# Patient Record
Sex: Female | Born: 1990 | Race: White | Hispanic: No | Marital: Single | Smoking: Current every day smoker
Health system: Southern US, Community
[De-identification: ages and names within clinical notes are randomized; demographics above are authoritative.]

## PROBLEM LIST (undated history)

## (undated) VITALS — BP 106/51 | HR 74 | Temp 98.2°F | Resp 18 | Ht 67.0 in | Wt 320.0 lb

## (undated) DIAGNOSIS — F319 Bipolar disorder, unspecified: Secondary | ICD-10-CM

## (undated) DIAGNOSIS — F32A Depression, unspecified: Secondary | ICD-10-CM

## (undated) DIAGNOSIS — F329 Major depressive disorder, single episode, unspecified: Secondary | ICD-10-CM

## (undated) DIAGNOSIS — S82899A Other fracture of unspecified lower leg, initial encounter for closed fracture: Secondary | ICD-10-CM

## (undated) DIAGNOSIS — F419 Anxiety disorder, unspecified: Secondary | ICD-10-CM

---
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
## 2011-09-18 LAB — GC/CHLAMYDIA PROBE AMP, URINE
Chlamydia, Swab/Urine, PCR: NEGATIVE
GC Probe Amp, Urine: NEGATIVE

## 2011-09-18 LAB — IRON AND TIBC
Saturation Ratios: 10 — ABNORMAL LOW
TIBC: 372

## 2011-09-18 LAB — TSH: TSH: 4.641

## 2011-09-18 LAB — PREGNANCY, URINE: Preg Test, Ur: NEGATIVE

## 2011-09-18 LAB — RPR: RPR Ser Ql: NONREACTIVE

## 2011-09-18 LAB — T4, FREE: Free T4: 1.22

## 2011-09-19 LAB — VALPROIC ACID LEVEL: Valproic Acid Lvl: 35.5 — ABNORMAL LOW

## 2011-09-29 LAB — LIPID PANEL: Total CHOL/HDL Ratio: 9.8

## 2011-09-29 LAB — FERRITIN: Ferritin: 19 (ref 10–291)

## 2011-09-29 LAB — TSH: TSH: 5.27 — ABNORMAL HIGH

## 2011-09-29 LAB — T3, FREE: T3, Free: 3.1 (ref 2.3–4.2)

## 2011-09-29 LAB — RPR: RPR Ser Ql: NONREACTIVE

## 2015-05-09 ENCOUNTER — Encounter (HOSPITAL_COMMUNITY): Payer: Self-pay | Admitting: *Deleted

## 2015-05-09 ENCOUNTER — Observation Stay (HOSPITAL_COMMUNITY)
Admission: AD | Admit: 2015-05-09 | Discharge: 2015-05-10 | Disposition: A | Discharge status: Home / Self Care | Payer: Self-pay | Attending: Psychiatry | Admitting: Psychiatry

## 2015-05-09 DIAGNOSIS — Z6841 Body Mass Index (BMI) 40.0 and over, adult: Secondary | ICD-10-CM | POA: Insufficient documentation

## 2015-05-09 DIAGNOSIS — F17210 Nicotine dependence, cigarettes, uncomplicated: Secondary | ICD-10-CM | POA: Insufficient documentation

## 2015-05-09 DIAGNOSIS — F419 Anxiety disorder, unspecified: Secondary | ICD-10-CM | POA: Insufficient documentation

## 2015-05-09 DIAGNOSIS — F3163 Bipolar disorder, current episode mixed, severe, without psychotic features: Principal | ICD-10-CM | POA: Diagnosis present

## 2015-05-09 HISTORY — DX: Anxiety disorder, unspecified: F41.9

## 2015-05-09 HISTORY — DX: Bipolar disorder, unspecified: F31.9

## 2015-05-09 HISTORY — DX: Depression, unspecified: F32.A

## 2015-05-09 HISTORY — DX: Major depressive disorder, single episode, unspecified: F32.9

## 2015-05-09 MED ORDER — MAGNESIUM HYDROXIDE 400 MG/5ML PO SUSP: 30.0000 mL | Freq: Every day | ORAL | Status: DC | PRN

## 2015-05-09 MED ORDER — LAMOTRIGINE 25 MG PO TABS
50.0000 mg | ORAL_TABLET | Freq: Every day | ORAL | Status: DC
  Filled 2015-05-09 (×2): qty 2

## 2015-05-09 MED ORDER — ACETAMINOPHEN 325 MG PO TABS: 650.0000 mg | ORAL_TABLET | Freq: Four times a day (QID) | ORAL | Status: DC | PRN

## 2015-05-09 MED ORDER — TRAZODONE HCL 50 MG PO TABS
50.0000 mg | ORAL_TABLET | Freq: Every evening | ORAL | Status: DC | PRN
  Administered 2015-05-09 (×2): 50 mg via ORAL
  Filled 2015-05-09 (×6): qty 1

## 2015-05-09 MED ORDER — HYDROXYZINE HCL 25 MG PO TABS
25.0000 mg | ORAL_TABLET | Freq: Four times a day (QID) | ORAL | Status: DC | PRN
  Administered 2015-05-09: 25 mg via ORAL
  Filled 2015-05-09: qty 1

## 2015-05-09 MED ORDER — ALUM & MAG HYDROXIDE-SIMETH 200-200-20 MG/5ML PO SUSP: 30.0000 mL | ORAL | Status: DC | PRN

## 2015-05-09 NOTE — Progress Notes (Signed)
Individual reports that she fell outside over the weekend while playing sports. She reports that she went to the ED and they told her, her right ankle was sprain.

## 2015-05-09 NOTE — Progress Notes (Signed)
Admission note: [facility]. She reports that she is in a depression and is here to get her "medication right." She was cooperative with the admission process onto the observation unit. She denies SI/HI at this time, but she reports suicide attempts in the past. She reports [facility]. She reports that she has been clean from pills, cocaine, and alcohol since September 2014. She reports she has a good support system, but this does not include her family, because they are all "addicts." Her belongings were locked in a locker- including clothes, sandals, cell phone, wallet, keys. Will continue to monitor on observation  Unit. Special checks q 15 mins in place to ensure safety.

## 2015-05-09 NOTE — Progress Notes (Signed)
Pt alert and cooperative. Affect/ mood sad and depressed. Denies SI/HI/A/V/H, verbally contracts for safety. Multiple phone conversations with family this evening. Emotional support and encouragement given. Will continue to monitor and evaluate for stabilization.
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
## 2015-05-10 DIAGNOSIS — R45851 Suicidal ideations: Secondary | ICD-10-CM

## 2015-05-10 DIAGNOSIS — F3132 Bipolar disorder, current episode depressed, moderate: Secondary | ICD-10-CM

## 2015-05-10 MED ORDER — GABAPENTIN 300 MG PO CAPS: 300.0000 mg | ORAL_CAPSULE | Freq: Two times a day (BID) | ORAL | Status: DC

## 2015-05-10 MED ORDER — GABAPENTIN 300 MG PO CAPS
300.0000 mg | ORAL_CAPSULE | Freq: Two times a day (BID) | ORAL | Status: DC
  Filled 2015-05-10 (×4): qty 1

## 2015-05-10 NOTE — [facility] Counselor (Signed)
Counselor met with pt at her bedside in the OBS unit to discuss discharge plans. Pt provided counselor with some background information in regards to her mental health illness and reported having panic attacks 3 to 4 times a day for two weeks. Pt reports feeling suicidal without a plan for about a week. Pt reports feeling depressed for about a week with symptoms including crying spells, worthlessness, hopelessness, helplessness, general depressed feelings, disinterest in things she used to care about, guilt, insomnia, increased irritability, and guilt. Pt reports that she has flashbacks that cause panic attacks as well as nightmares, and she avoids triggers for her flashbacks. Pt reports that she hadn't had a panic attack since 8/15 until they changed her medications. Pt reports that she does have manic episodes that can last as long as 3 months with symptoms including decreased sleep, increased energy, increased recklessness, increased productivity and anger. Pt currently denies SI/HI/AVH. Pt would like to discharge her psychiatric services from [facility] and start services at [facility].Pt will be provided information on how to expedite this change in an ongoing effort to ensure continuity of care for her mental health diagnosis. This information will be shared with OBS day Counselor for follow up. Pt will be re-evaluated in the morning by an extender to determine appropriate disposition.  Redmond Pulling, MA OBS Counselor

## 2015-05-10 NOTE — Discharge Instructions (Addendum)
For your continued mental health care, it is recommended that you continue counseling with [facility].  For your continued psychiatric needs, it is recommended that you obtain assistance thru either of the two agencies listed below. They both operate on a first come, first serve walk in basis. The walk in hours are listed as well: [facility] [address] [place], [place] [address] [phone] Walk in hours are Monday - Friday 8:00am - 3:00pm  [facility] [address] [place], [place] [phone] Walk in hours are 8:30am - 12:00pm and 2:00pm - 3:30pm  .fs Bipolar Disorder Bipolar disorder is a mental illness. The term bipolar disorder actually is used to describe a group of disorders that all share varying degrees of emotional highs and lows that can interfere with daily functioning, such as work, school, or relationships. Bipolar disorder also can lead to drug abuse, hospitalization, and suicide. The emotional highs of bipolar disorder are periods of elation or irritability and high energy. These highs can range from a mild form (hypomania) to a severe form (mania). People experiencing episodes of hypomania may appear energetic, excitable, and highly productive. People experiencing mania may behave impulsively or erratically. They often make poor decisions. They may have difficulty sleeping. The most severe episodes of mania can involve having very distorted beliefs or perceptions about the world and seeing or hearing things that are not real (psychotic delusions and hallucinations).  The emotional lows of bipolar disorder (depression) also can range from mild to severe. Severe episodes of bipolar depression can involve psychotic delusions and hallucinations. Sometimes people with bipolar disorder experience a state of mixed mood. Symptoms of hypomania or mania and depression are both present during this mixed-mood episode. SIGNS AND SYMPTOMS There are  signs and symptoms of the episodes of hypomania and mania as well as the episodes of depression. The signs and symptoms of hypomania and mania are similar but vary in severity. They include:  Inflated self-esteem or feeling of increased self-confidence.  Decreased need for sleep.  Unusual talkativeness (rapid or pressured speech) or the feeling of a need to keep talking.  Sensation of racing thoughts or constant talking, with quick shifts between topics that may or may not be related (flight of ideas).  Decreased ability to focus or concentrate.  Increased purposeful activity, such as work, studies, or social activity, or nonproductive activity, such as pacing, squirming and fidgeting, or finger and toe tapping.  Impulsive behavior and use of poor judgment, resulting in high-risk activities, such as having unprotected sex or spending excessive amounts of money. Signs and symptoms of depression include the following:   Feelings of sadness, hopelessness, or helplessness.  Frequent or uncontrollable episodes of crying.  Lack of feeling anything or caring about anything.  Difficulty sleeping or sleeping too much.  Inability to enjoy the things you used to enjoy.   Desire to be alone all the time.   Feelings of guilt or worthlessness.  Lack of energy or motivation.   Difficulty concentrating, remembering, or making decisions.  Change in appetite or weight beyond normal fluctuations.  Thoughts of death or the desire to harm yourself. DIAGNOSIS  Bipolar disorder is diagnosed through an assessment by your caregiver. Your caregiver will ask questions about your emotional episodes. There are two main types of bipolar disorder. People with type I bipolar disorder have manic episodes with or without depressive episodes. People with type II bipolar disorder have hypomanic episodes and major depressive episodes, which  are more serious than mild depression. The type of bipolar disorder  you have can make an important difference in how your illness is monitored and treated. Your caregiver may ask questions about your medical history and use of alcohol or drugs, including prescription medication. Certain medical conditions and substances also can cause emotional highs and lows that resemble bipolar disorder (secondary bipolar disorder).  TREATMENT  Bipolar disorder is a long-term illness. It is best controlled with continuous treatment rather than treatment only when symptoms occur. The following treatments can be prescribed for bipolar disorders:  Medication--Medication can be prescribed by a doctor that is an expert in treating mental disorders (psychiatrists). Medications called mood stabilizers are usually prescribed to help control the illness. Other medications are sometimes added if symptoms of mania, depression, or psychotic delusions and hallucinations occur despite the use of a mood stabilizer.  Talk therapy--Some forms of talk therapy are helpful in providing support, education, and guidance. A combination of medication and talk therapy is best for managing the disorder over time. A procedure in which electricity is applied to your brain through your scalp (electroconvulsive therapy) is used in cases of severe mania when medication and talk therapy do not work or work too slowly. Document Released: 03/23/2001 Document Revised: 04/11/2013 Document Reviewed: 01/10/2013 [facility] Patient Information 2015 [employer] [employer] [place]. This information is not intended to replace advice given to you by your health care provider. Make sure you discuss any questions you have with your health care provider.

## 2015-05-10 NOTE — Progress Notes (Signed)
Patient ID: Monica Huffman, female   DOB: Oct 19, 1991, 24 y.o.   MRN: [id number] Patient discharged per MD orders. Patient given education regarding follow-up appointments and medications. Patient denies any questions or concerns about these instructions. Patient was escorted to locker and given belongings before discharge to hospital lobby. Patient currently denies SI/HI and auditory and visual hallucinations on discharge.
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
## 2015-05-10 NOTE — Progress Notes (Signed)
Patient ID: [name], female   DOB: 03-18-1991, 24 y.o.   MRN: [id number] Patient pleasant and cooperative this AM. Denies SI, HI, AVH. Patient states she feels better. Rates depression 3 on 1 to 10 scale with 10 being the worst.  Patient has flat affect. Patient refused Lamictal this AM stating it does not help her. Patient asked to talk to provider about changing meds. Patient stated she had SI  Yesterday but not today. HX of overdose 2 years ago. Denies pain or discomfort. Plans to stop going to [facility] and [facility].

## 2015-05-10 NOTE — BHH Counselor (Addendum)
[facility] Assessment Progress Note  Counselor spoke with pt this morning about discharge planning and her current mental and social state. Pt states that she is not currently suicidal or homicidal and she has no AVH. She states that she would be okay with being discharged today and indicated that a friend will be able to come pick her up. She added that she [facility] [place] [facility] [place] [facility] [place].  When asked about psychiatric needs, pt indicated that she currently goes to [facility], but does not want to go there anymore, due to "having problems" with them for 1.5 years. She shared that the problems consisted of [facility] continually giving her medications, after she has advised them of the adverse affects she has had in the past with the same medications. She feels that they don't listen to her. Pt shared that she would like to [facility], as she has friends that go there and are satisfied with their services. Pt also indicated that she has [facility] services [facility], where she [facility].   Pt is aware of the walk-in policies of [facility] and willing to utilize that. Counselor also advised [facility] about [facility], who may also be able to assist her with psychiatric services.  Counselor will indicate both agencies on the discharge instructions.  Marcelle SmilingSamantha Ranjit Ashurst, MS, [facility] Counselor

## 2015-05-10 NOTE — [facility] Suicide Risk Assessment (Signed)
Suicide Risk Assessment  Discharge Assessment   [facility]   Demographic Factors:  Caucasian  Total Time spent with patient: 30 minutes  Musculoskeletal: Strength & Muscle Tone: within normal limits Gait & Station: normal Patient leans: N/A  Psychiatric Specialty Exam: Physical Exam  Review of Systems  Constitutional: Negative.  HENT: Negative.  Eyes: Negative.  Respiratory: Negative.  Cardiovascular: Negative.  Gastrointestinal: Negative.  Genitourinary: Negative.  Musculoskeletal: Negative.  Skin: Negative.  Neurological: Negative.  Endo/Heme/Allergies: Negative.  Psychiatric/Behavioral: The patient is nervous/anxious.    Blood pressure 106/51, pulse 74, temperature 98.2 F (36.8 C), temperature source Oral, resp. rate 18, height 5\' 7"  (1.702 m), weight 320 lb (145.151 kg), last menstrual period 05/06/2015.Body mass index is 50.11 kg/(m^2).  General Appearance: Casual  Eye Contact:: Good  Speech: Normal Rate  Volume: Normal  Mood: Anxious, mild  Affect: Congruent  Thought Process: Coherent  Orientation: Full (Time, Place, and Person)  Thought Content: WDL  Suicidal Thoughts: No  Homicidal Thoughts: No  Memory: Immediate; Good Recent; Good Remote; Good  Judgement: Good  Insight: Good  Psychomotor Activity: Normal  Concentration: Good  Recall: Good  Fund of Knowledge:Good  Language: Good  Akathisia: No  Handed: Right  AIMS (if indicated):    Assets: Housing Leisure Time Physical Health Resilience Social Support  ADL's: Intact  Cognition: WNL  Sleep:         Have you used any form of tobacco in the last 30 days? (Cigarettes, Smokeless Tobacco, Cigars, and/or Pipes): Yes  Has this patient used any form of tobacco in the last 30 days? (Cigarettes, Smokeless Tobacco, Cigars, and/or Pipes) No  Mental Status Per Nursing Assessment::   On Admission:   NA  Current Mental Status by Physician: NA  Loss Factors: NA  Historical Factors: NA  Risk Reduction Factors:   Sense of responsibility to family, Living with another person, especially a relative, Positive social support and Positive therapeutic relationship  Continued Clinical Symptoms:  Mild anxiety  Cognitive Features That Contribute To Risk:  None    Suicide Risk:  Minimal: No identifiable suicidal ideation.  Patients presenting with no risk factors but with morbid ruminations; may be classified as minimal risk based on the severity of the depressive symptoms  Principal Problem: Bipolar 1 disorder, mixed, severe Discharge Diagnoses:  Patient Active Problem List   Diagnosis Date Noted  . Bipolar 1 disorder, mixed, severe [F31.63] 05/09/2015    Priority: High      Plan Of Care/Follow-up recommendations:  Activity:  as tolerated Diet:  heart healthy diet  Is patient on multiple antipsychotic therapies at discharge:  No   Has Patient had three or more failed trials of antipsychotic monotherapy by history:  No  Recommended Plan for Multiple Antipsychotic Therapies: NA    LORD, JAMISON, PMH-NP 05/10/2015, 2:30 PM
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
## 2015-10-18 ENCOUNTER — Inpatient Hospital Stay (HOSPITAL_COMMUNITY)
Admission: RE | Admit: 2015-10-18 | Discharge: 2015-10-21 | DRG: 885 | Disposition: A | Discharge status: Home / Self Care | Payer: Federal, State, Local not specified - Other | Attending: Psychiatry | Admitting: Psychiatry

## 2015-10-18 ENCOUNTER — Encounter (HOSPITAL_COMMUNITY): Payer: Self-pay | Admitting: *Deleted

## 2015-10-18 DIAGNOSIS — F3130 Bipolar disorder, current episode depressed, mild or moderate severity, unspecified: Secondary | ICD-10-CM | POA: Diagnosis present

## 2015-10-18 DIAGNOSIS — F329 Major depressive disorder, single episode, unspecified: Secondary | ICD-10-CM | POA: Diagnosis present

## 2015-10-18 DIAGNOSIS — F411 Generalized anxiety disorder: Secondary | ICD-10-CM | POA: Diagnosis not present

## 2015-10-18 DIAGNOSIS — F4310 Post-traumatic stress disorder, unspecified: Secondary | ICD-10-CM | POA: Diagnosis not present

## 2015-10-18 DIAGNOSIS — F17210 Nicotine dependence, cigarettes, uncomplicated: Secondary | ICD-10-CM | POA: Diagnosis present

## 2015-10-18 DIAGNOSIS — R45851 Suicidal ideations: Secondary | ICD-10-CM | POA: Diagnosis present

## 2015-10-18 MED ORDER — INFLUENZA VAC SPLIT QUAD 0.5 ML IM SUSY
0.5000 mL | PREFILLED_SYRINGE | INTRAMUSCULAR | Status: DC
  Filled 2015-10-18: qty 0.5

## 2015-10-18 MED ORDER — MAGNESIUM HYDROXIDE 400 MG/5ML PO SUSP: 30.0000 mL | Freq: Every day | ORAL | Status: DC | PRN

## 2015-10-18 MED ORDER — LAMOTRIGINE 25 MG PO TABS
50.0000 mg | ORAL_TABLET | Freq: Every day | ORAL | Status: DC
  Filled 2015-10-18 (×2): qty 2

## 2015-10-18 MED ORDER — ALUM & MAG HYDROXIDE-SIMETH 200-200-20 MG/5ML PO SUSP: 30.0000 mL | ORAL | Status: DC | PRN

## 2015-10-18 MED ORDER — PNEUMOCOCCAL VAC POLYVALENT 25 MCG/0.5ML IJ INJ: 0.5000 mL | INJECTION | INTRAMUSCULAR | Status: DC

## 2015-10-18 MED ORDER — GABAPENTIN 300 MG PO CAPS
300.0000 mg | ORAL_CAPSULE | Freq: Two times a day (BID) | ORAL | Status: DC
  Administered 2015-10-18 – 2015-10-19 (×2): 300 mg via ORAL
  Filled 2015-10-18 (×5): qty 1

## 2015-10-18 MED ORDER — TRAZODONE HCL 50 MG PO TABS
50.0000 mg | ORAL_TABLET | Freq: Every evening | ORAL | Status: DC | PRN
  Administered 2015-10-18 – 2015-10-20 (×3): 50 mg via ORAL
  Filled 2015-10-18 (×2): qty 1
  Filled 2015-10-18: qty 14
  Filled 2015-10-18: qty 1

## 2015-10-18 MED ORDER — ACETAMINOPHEN 325 MG PO TABS
650.0000 mg | ORAL_TABLET | Freq: Four times a day (QID) | ORAL | Status: DC | PRN
  Administered 2015-10-19 – 2015-10-21 (×3): 650 mg via ORAL
  Filled 2015-10-18 (×3): qty 2
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
## 2015-10-18 NOTE — Tx Team (Signed)
Initial Interdisciplinary Treatment Plan   PATIENT STRESSORS: Educational concerns Financial difficulties Occupational concerns Substance abuse   PATIENT STRENGTHS: Ability for insight Active sense of humor Average or above average intelligence Capable of independent living   PROBLEM LIST: Problem List/Patient Goals Date to be addressed Date deferred Reason deferred Estimated date of resolution  Suicidal Ideation 10/18/2015     Depression with Anxiety 10/18/2015     Substance Abuse 10/18/2015     "Ive been sober over 1 1/2 yrs"      " I need my meds adjusted"                               DISCHARGE CRITERIA:  Ability to meet basic life and health needs Adequate post-discharge living arrangements Improved stabilization in mood, thinking, and/or behavior Medical problems require only outpatient monitoring  PRELIMINARY DISCHARGE PLAN: Attend aftercare/continuing care group Attend PHP/IOP Attend 12-step recovery group Outpatient therapy  PATIENT/FAMIILY INVOLVEMENT: This treatment plan has been presented to and reviewed with the patient, Alinda DoomsJasmin M Czajkowski, and/or family member, .  The patient and family have been given the opportunity to ask questions and make suggestions.  Rich BraveDuke, Krystian Younglove Lynn 10/18/2015, 6:45 PM

## 2015-10-18 NOTE — Progress Notes (Signed)
Monica Huffman is a very tearful, sad 24 yo single cuacsain obese female [facility] ( she was a [facility]) . She says " [name] been sober 2 yrs...- the patient lives. In a rehab facilittY) and I dont know what went wrong;.Marland Kitchen.Marland Kitchen.Marland Kitchen.[name] been having suicidal thoguths for the past 2 weeks. She shares her history with this nurse, is generally agreeable to the admission process until she finds out from this writer that she is being admitted....then she got VERY upset. After speaking with her, allowing her to cry and to process and listening to her , pt agreed to finish admission process with this Clinical research associatewriter, walk voluntarily on the unit and allow nurse to get her admission data inputted into the computer.Marland Kitchen.Marland Kitchen.Marland Kitchen.She deneis active SI , deneis allergies and says she smokes cigarettes and that shes " been clean " over 2 yrs.( from alcohol and other stuff) and says she has depression, anxiety and obesity as medical problems. She contracts for safety, states she doesn't " need to be here" and admission is completed.

## 2015-10-18 NOTE — Progress Notes (Signed)
Pt attended karaoke group this evening.  

## 2015-10-19 ENCOUNTER — Encounter (HOSPITAL_COMMUNITY): Payer: Self-pay | Admitting: Psychiatry

## 2015-10-19 DIAGNOSIS — F411 Generalized anxiety disorder: Secondary | ICD-10-CM

## 2015-10-19 DIAGNOSIS — F3130 Bipolar disorder, current episode depressed, mild or moderate severity, unspecified: Secondary | ICD-10-CM | POA: Diagnosis present

## 2015-10-19 DIAGNOSIS — F4310 Post-traumatic stress disorder, unspecified: Secondary | ICD-10-CM

## 2015-10-19 LAB — URINALYSIS, ROUTINE W REFLEX MICROSCOPIC
Bilirubin Urine: NEGATIVE
GLUCOSE, UA: NEGATIVE mg/dL
HGB URINE DIPSTICK: NEGATIVE
Ketones, ur: NEGATIVE mg/dL
Nitrite: NEGATIVE
PROTEIN: NEGATIVE mg/dL
SPECIFIC GRAVITY, URINE: 1.027 (ref 1.005–1.030)
Urobilinogen, UA: 0.2 mg/dL (ref 0.0–1.0)
pH: 5.5 (ref 5.0–8.0)

## 2015-10-19 LAB — URINE MICROSCOPIC-ADD ON

## 2015-10-19 MED ORDER — GABAPENTIN 400 MG PO CAPS
400.0000 mg | ORAL_CAPSULE | Freq: Two times a day (BID) | ORAL | Status: DC
  Administered 2015-10-19 – 2015-10-21 (×4): 400 mg via ORAL
  Filled 2015-10-19 (×5): qty 1
  Filled 2015-10-19 (×2): qty 14
  Filled 2015-10-19: qty 1
  Filled 2015-10-19: qty 14
  Filled 2015-10-19 (×2): qty 1
  Filled 2015-10-19: qty 14

## 2015-10-19 MED ORDER — NICOTINE 21 MG/24HR TD PT24
21.0000 mg | MEDICATED_PATCH | Freq: Every day | TRANSDERMAL | Status: DC
  Administered 2015-10-19 – 2015-10-21 (×3): 21 mg via TRANSDERMAL
  Filled 2015-10-19 (×6): qty 1

## 2015-10-19 MED ORDER — GABAPENTIN 300 MG PO CAPS: 300.0000 mg | ORAL_CAPSULE | Freq: Every day | ORAL | Status: DC

## 2015-10-19 MED ORDER — LITHIUM CARBONATE ER 300 MG PO TBCR
300.0000 mg | EXTENDED_RELEASE_TABLET | Freq: Every day | ORAL | Status: DC
  Administered 2015-10-19 – 2015-10-21 (×3): 300 mg via ORAL
  Filled 2015-10-19: qty 1
  Filled 2015-10-19 (×2): qty 7
  Filled 2015-10-19 (×2): qty 1

## 2015-10-19 MED ORDER — GABAPENTIN 400 MG PO CAPS
400.0000 mg | ORAL_CAPSULE | Freq: Every day | ORAL | Status: DC
  Administered 2015-10-19 – 2015-10-20 (×2): 400 mg via ORAL
  Filled 2015-10-19 (×2): qty 1
  Filled 2015-10-19: qty 7
  Filled 2015-10-19 (×2): qty 1
  Filled 2015-10-19: qty 7

## 2015-10-19 MED ORDER — LITHIUM CARBONATE ER 300 MG PO TBCR
600.0000 mg | EXTENDED_RELEASE_TABLET | Freq: Two times a day (BID) | ORAL | Status: DC
  Administered 2015-10-19 – 2015-10-21 (×5): 600 mg via ORAL
  Filled 2015-10-19 (×3): qty 2
  Filled 2015-10-19: qty 28
  Filled 2015-10-19 (×5): qty 2
  Filled 2015-10-19: qty 28
  Filled 2015-10-19 (×2): qty 2
  Filled 2015-10-19 (×2): qty 28

## 2015-10-19 NOTE — Progress Notes (Signed)
Recreation Therapy Notes  Date: 10.21.2016 Time: 9:30am Location: [address] Group Room   Group Topic: Stress Management  Goal Area(s) Addresses:  Patient will actively participate in stress management techniques presented during session.   Behavioral Response: Did not attend.     Markelle Asaro L Milano Rosevear, LRT/CTRS        Janee Ureste L 10/19/2015 1:35 PM 

## 2015-10-19 NOTE — Progress Notes (Signed)
D: Monica Huffman appeared depressed this a.m. She reported good sleep, fair appetite, and low energy. She rated her depression 8, hopelessness 7, and anxiety 8. She admitted some SI but contracted for safety. She complained of a headache (5/10). She wanted a nicotine patch. She declined flu and pneumonia vaccines. A: Meds given as ordered,including PRN Tylenol. Patch ordered. Q15 safety checks maintained. Support/encouragement offered. R: Pt remains free from harm and continues with treatment. Will continue to monitor for needs/safety.

## 2015-10-19 NOTE — [facility] Group Notes (Signed)
[facility] LCSW Group Therapy  10/19/2015 1:08 PM  Type of Therapy:  Group Therapy  Participation Level:  Active  Participation Quality:  Attentive  Affect:  Appropriate  Cognitive:  Alert and Oriented  Insight:  Improving  Engagement in Therapy:  Improving  Modes of Intervention:  Confrontation, Discussion, Education, Exploration, Problem-solving, Rapport Building, Socialization and Support  Summary of Progress/Problems: Feelings around Relapse. Group members discussed the meaning of relapse and shared personal stories of relapse, how it affected them and others, and how they perceived themselves during this time. Group members were encouraged to identify triggers, warning signs and coping skills used when facing the possibility of relapse. Social supports were discussed and explored in detail. Delisia was attentive and engaged during today's processing group. She shared that she could feel her mental health relapse coming before it happened. "I was doing manic things and spending a lot of money. That's a sign that I'm manic and about to crash." Pt reports feeling SI for 2 weeks and feeling [facility]. "I learned that it is safe to talk to my counselor about my mental health through this experience."   Smart, Kayce Betty LCSWA 10/19/2015, 1:08 PM

## 2015-10-19 NOTE — Progress Notes (Signed)
Pt attended AA meeting and was appropriate throughout the meeting.  
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
## 2015-10-19 NOTE — Plan of Care (Signed)
Problem: Alteration in mood & ability to function due to Goal: STG-Patient will attend groups Outcome: Progressing Pt attended evening group on 10/18/15.

## 2015-10-19 NOTE — Progress Notes (Signed)
  [facility] :  Will you be returning to the same living situation after discharge:  Yes,  [facility] At discharge, do you have transportation home?: Yes,  counselor from [facility] will pick her up on Sunday at 1:30PM. Do you have the ability to pay for your medications: Yes,  mental health  Release of information consent forms completed and submitted to medical records by CSW.  Patient to Follow up at: Follow-up Information    Follow up with [facility] On 10/29/2015.   Why:  Appt on this date at 1:40PM for medication management/hospital follow-up.    Contact information:   [address] [place], [place] [address] Phone: [phone] Fax: [phone]      Follow up with [facility].   Why:  Return to [facility] at discharge.    Contact information:   [address] [place], [place] [address] Phone: [phone] Fax: [phone]      Patient denies SI/HI: Yes,  during group/self report.     Safety Planning and Suicide Prevention discussed: Yes,  SPE completed with pt. Contact attempts [facility]. pt provided with SPI pamphlet and encouraged to share information with support network, ask questions, and talk about any concerns relating to SPE.  Have you used any form of tobacco in the last 30 days? (Cigarettes, Smokeless Tobacco, Cigars, and/or Pipes): Yes  Has patient been referred to the Quitline?: Patient refused referral  Smart, Lebron QuamHeather LCSWA  10/19/2015, 3:10 PM

## 2015-10-19 NOTE — Progress Notes (Addendum)
D: Pt is in the hallway talking on the phone upon approach.  She has depressed affect and mood.  She attended [facility] and reports that it was "good."  Pt reports SI without a plan.  She verbally contracts for safety.  She denies HII, denies hallucinations, denies pain.     A: Introduced self to pt.  Met with pt 1:1 and provided support and encouragement.  On-site provider contacted for admission orders.  Medications administered per order.  PRN medication administered for sleep. R: Pt is compliant with medications.  Pt verbally contracts for safety.  Will continue to monitor and assess.

## 2015-10-19 NOTE — [facility] Group Notes (Signed)
[facility] Note   10/19/2015 10:38 AM  Participation Quality:  Appropriate   Mood/Affect:  Depressed and Flat  Depression Rating:  7  Anxiety Rating:  7  Thoughts of Suicide:  No Will you contract for safety?   NA  Current AVH:  No  Plan for Discharge/Comments:  Pt [facility] and [facility]. Dx of bipolar disorder and reports recent depressive crash after manic episode-need for medication stablization. "I've been suicidal for the past two weeks."   Transportation Means: counselor from [facility] will transport pt home.   Supports: counselors from [facility]/sisters.   Smart, American FinancialHeather LCSWA
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
## 2015-10-19 NOTE — [facility] Suicide Risk Assessment (Signed)
[facility]   Nursing information obtained from:    Demographic factors:    Current Mental Status:    Loss Factors:    Historical Factors:    Risk Reduction Factors:    Total Time spent with patient: 45 minutes Principal Problem: <principal problem not specified> Diagnosis:   Patient Active Problem List   Diagnosis Date Noted  . GAD (generalized anxiety disorder) [F41.1] 10/18/2015  . Bipolar 1 disorder, mixed, severe (HCC) [F31.63] 05/09/2015     Continued Clinical Symptoms:  Alcohol Use Disorder Identification Test Final Score (AUDIT): 2 The "Alcohol Use Disorders Identification Test", Guidelines for Use in Primary Care, Second Edition.  World Science writerHealth Organization [facility](WHO). Score between 0-7:  no or low risk or alcohol related problems. Score between 8-15:  moderate risk of alcohol related problems. Score between 16-19:  high risk of alcohol related problems. Score 20 or above:  warrants further diagnostic evaluation for alcohol dependence and treatment.   CLINICAL FACTORS:   Bipolar Disorder:   Depressive phase Alcohol/Substance Abuse/Dependencies  Psychiatric Specialty Exam: Physical Exam  ROS  Blood pressure 115/62, pulse 78, temperature 98.1 F (36.7 C), temperature source Oral, resp. rate 16, height 5\' 7"  (1.702 m), weight 145.151 kg (320 lb), last menstrual period 10/10/2015, SpO2 96 %.Body mass index is 50.11 kg/(m^2).   COGNITIVE FEATURES THAT CONTRIBUTE TO RISK:  Closed-mindedness, Polarized thinking and Thought constriction (tunnel vision)    SUICIDE RISK:   Moderate:  Frequent suicidal ideation with limited intensity, and duration, some specificity in terms of plans, no associated intent, good self-control, limited dysphoria/symptomatology, some risk factors present, and identifiable protective factors, including available and accessible social support.  PLAN OF CARE: see Admission H and P  Medical Decision Making:  Review of  Psycho-Social Stressors (1), Review or order clinical lab tests (1), Review of Medication Regimen & Side Effects (2) and Review of New Medication or Change in Dosage (2)  I certify that inpatient services furnished can reasonably be expected to improve the patient's condition.   Jacqeline Broers A 10/19/2015, 12:38 PM

## 2015-10-19 NOTE — [facility] Suicide Risk Assessment (Signed)
[facility] INPATIENT:  Family/Significant Other Suicide Prevention Education  Suicide Prevention Education:  Contact Attempts: Burley Savereresa Hinkle (pt's counselor/support at [facility]) [phone] has been identified by the patient as the family member/significant other with whom the patient will be residing, and identified as the person(s) who will aid the patient in the event of a mental health crisis.  With written consent from the patient, two attempts were made to provide suicide prevention education, prior to and/or following the patient's discharge.  We were unsuccessful in providing suicide prevention education.  A suicide education pamphlet was given to the patient to share with family/significant other.  Date and time of first attempt: 10/19/15 at 11:23AM (message left for her to call back at earliest convenience).  Date and time of second attempt: 10/19/15 at 3:00PM (message left)   Smart, Burrel Legrand  LCSWA   10/19/2015, 11:22 AM

## 2015-10-20 LAB — COMPREHENSIVE METABOLIC PANEL
ALT: 14 U/L (ref 14–54)
ANION GAP: 6 (ref 5–15)
AST: 18 U/L (ref 15–41)
Albumin: 3.7 g/dL (ref 3.5–5.0)
Alkaline Phosphatase: 47 U/L (ref 38–126)
BILIRUBIN TOTAL: 0.4 mg/dL (ref 0.3–1.2)
BUN: 10 mg/dL (ref 6–20)
CO2: 27 mmol/L (ref 22–32)
Calcium: 9 mg/dL (ref 8.9–10.3)
Chloride: 107 mmol/L (ref 101–111)
Creatinine, Ser: 0.66 mg/dL (ref 0.44–1.00)
GFR calc Af Amer: >60 mL/min (ref >60)
GFR calc non Af Amer: >60 mL/min (ref >60)
GLUCOSE: 94 mg/dL (ref 65–99)
Potassium: 4.4 mmol/L (ref 3.5–5.1)
Sodium: 140 mmol/L (ref 135–145)
TOTAL PROTEIN: 6.9 g/dL (ref 6.5–8.1)

## 2015-10-20 LAB — CBC
HEMATOCRIT: 39.8 % (ref 36.0–46.0)
HEMOGLOBIN: 12.3 g/dL (ref 12.0–15.0)
MCH: 26.7 pg (ref 26.0–34.0)
MCHC: 30.9 g/dL (ref 30.0–36.0)
MCV: 86.5 fL (ref 78.0–100.0)
PLATELETS: 326 10*3/uL (ref 150–400)
RBC: 4.6 MIL/uL (ref 3.87–5.11)
RDW: 14.8 % (ref 11.5–15.5)
WBC: 10.7 10*3/uL — AB (ref 4.0–10.5)

## 2015-10-20 LAB — TSH: TSH: 5.274 u[IU]/mL — ABNORMAL HIGH (ref 0.350–4.500)

## 2015-10-20 NOTE — Progress Notes (Signed)
[facility] MD Progress Note  10/20/2015 12:37 PM Monica Huffman  MRN:  [id number] Subjective:  Pt states: "I'm doing much better today. I'm not feeling suicidal. The medication is definitely helping."  Objective: Pt seen and chart reviewed. Pt is alert/oriented x4, calm, cooperative, and appropriate to situation. Denies suicidal/homicidal ideation and psychosis and does not appear to be responding to internal stimuli. Cites good sleep and good appetite. Reports benefit from group participation. Denies medication side effects. Pt reports that she would like to discharge this weekend so that she can focus on her job situation.   Principal Problem: Bipolar I disorder, most recent episode depressed ([place]) Diagnosis:   Patient Active Problem List   Diagnosis Date Noted  . PTSD (post-traumatic stress disorder) [F43.10] 10/19/2015    Priority: High  . Bipolar I disorder, most recent episode depressed ([place]) [F31.30] 10/19/2015    Priority: High  . GAD (generalized anxiety disorder) [F41.1] 10/18/2015    Priority: High   Total Time spent with patient: 15 minutes  Past Medical History:  Past Medical History  Diagnosis Date  . Bipolar disorder ([place])   . Anxiety   . Depression    History reviewed. No pertinent past surgical history. Family History:  Family History  Problem Relation Age of Onset  . Ovarian cancer Mother   . Alcoholism Mother   . Drug abuse Mother   . 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 Marital Status: Single    Spouse Name: N/A  . Number of Children: N/A  . Years of Education: N/A   Social History Main Topics  . Smoking status: Current Every Day Smoker -- 1.00 packs/day for 10 years    Types:  Cigarettes  . Smokeless tobacco: None  . 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 Sexual Activity: Not Currently   Other Topics Concern  . None   Social History Narrative   Additional Social History:    Pain Medications: Pt denies Prescriptions: Neurontin and Lithium Over the Counter: Pt denies History of alcohol / drug use?: Yes Name of Substance 1: Alcohol  1 - Age of First Use: Unknown 1 - Amount (size/oz): Unknown 1 - Frequency: Unknown 1 - Duration: 2 years ago 1 - Last Use / Amount: 2 years ago Name of Substance 2: Opiates  2 - Age of First Use: Unknown 2 - Amount (size/oz): Unknown 2 - Frequency: Unknown 2 - Duration: 2 years ago 2 - Last Use / Amount: 2 years ago                Sleep: Good  Appetite:  Good  Current Medications: Current Facility-Administered Medications  Medication Dose Route Frequency Provider Last Rate Last Dose  . acetaminophen (TYLENOL) tablet 650 mg  650 mg Oral Q6H PRN Harriet Butte, NP   650 mg at 10/20/15 0755  . alum & mag hydroxide-simeth (MAALOX/MYLANTA) 200-200-20 MG/5ML suspension 30  mL  30 mL Oral Q4H PRN Harriet Butte, NP      . gabapentin (NEURONTIN) capsule 400 mg  400 mg Oral BID Nicholaus Bloom, MD   400 mg at 10/20/15 0755  . gabapentin (NEURONTIN) capsule 400 mg  400 mg Oral QHS Nicholaus Bloom, MD   400 mg at 10/19/15 2158  . Influenza vac split quadrivalent PF (FLUARIX) injection 0.5 mL  0.5 mL Intramuscular Tomorrow-1000 Nicholaus Bloom, MD   0 mL at 10/19/15 0844  . lithium carbonate (LITHOBID) CR tablet 300 mg  300 mg Oral Q1200 Nicholaus Bloom, MD   300 mg at 10/20/15 1129  . lithium carbonate (LITHOBID) CR tablet 600 mg  600 mg Oral Q12H Nicholaus Bloom, MD   600 mg at 10/20/15 0755  . magnesium hydroxide (MILK OF MAGNESIA) suspension 30  mL  30 mL Oral Daily PRN Harriet Butte, NP      . nicotine (NICODERM CQ - dosed in mg/24 hours) patch 21 mg  21 mg Transdermal Daily Nicholaus Bloom, MD   21 mg at 10/20/15 0755  . pneumococcal 23 valent vaccine (PNU-IMMUNE) injection 0.5 mL  0.5 mL Intramuscular Tomorrow-1000 Nicholaus Bloom, MD   0.5 mL at 10/19/15 0846  . traZODone (DESYREL) tablet 50 mg  50 mg Oral QHS PRN Harriet Butte, NP   50 mg at 10/19/15 2158    Lab Results:  Results for orders placed or performed during the hospital encounter of 10/18/15 (from the past 48 hour(s))  Urinalysis, Routine w reflex microscopic (not at [facility])     Status: Abnormal   Collection Time: 10/19/15 12:57 PM  Result Value Ref Range   Color, Urine YELLOW YELLOW   APPearance TURBID (A) CLEAR   Specific Gravity, Urine 1.027 1.005 - 1.030   pH 5.5 5.0 - 8.0   Glucose, UA NEGATIVE NEGATIVE mg/dL   Hgb urine dipstick NEGATIVE NEGATIVE   Bilirubin Urine NEGATIVE NEGATIVE   Ketones, ur NEGATIVE NEGATIVE mg/dL   Protein, ur NEGATIVE NEGATIVE mg/dL   Urobilinogen, UA 0.2 0.0 - 1.0 mg/dL   Nitrite NEGATIVE NEGATIVE   Leukocytes, UA TRACE (A) NEGATIVE    Comment: Performed at [facility]  Urine microscopic-add on     Status: Abnormal   Collection Time: 10/19/15 12:57 PM  Result Value Ref Range   Squamous Epithelial / LPF MANY (A) RARE   WBC, UA 3-6 <3 WBC/hpf   Urine-Other AMORPHOUS URATES/PHOSPHATES     Comment: Performed at [facility]  Comprehensive metabolic panel     Status: None   Collection Time: 10/20/15  5:00 AM  Result Value Ref Range   Sodium 140 135 - 145 mmol/L   Potassium 4.4 3.5 - 5.1 mmol/L   Chloride 107 101 - 111 mmol/L   CO2 27 22 - 32 mmol/L   Glucose, Bld 94 65 - 99 mg/dL   BUN 10 6 - 20 mg/dL   Creatinine, Ser 0.66 0.44 - 1.00 mg/dL   Calcium 9.0 8.9 - 10.3 mg/dL   Total Protein 6.9 6.5 - 8.1 g/dL   Albumin 3.7 3.5 - 5.0 g/dL   AST 18 15 - 41 U/L   ALT 14 14 - 54 U/L   Alkaline  Phosphatase 47 38 - 126 U/L   Total Bilirubin 0.4 0.3 - 1.2 mg/dL   GFR calc non Af Amer >60 >60 mL/min   GFR calc Af Amer >60 >60 mL/min  Comment: (NOTE) The eGFR has been calculated using the CKD EPI equation. This calculation has not been validated in all clinical situations. eGFR's persistently <60 mL/min signify possible Chronic Kidney Disease.    Anion gap 6 5 - 15    Comment: Performed at [facility]  TSH     Status: Abnormal   Collection Time: 10/20/15  5:00 AM  Result Value Ref Range   TSH 5.274 (H) 0.350 - 4.500 uIU/mL    Comment: Performed at [facility]  CBC     Status: Abnormal   Collection Time: 10/20/15  5:00 AM  Result Value Ref Range   WBC 10.7 (H) 4.0 - 10.5 K/uL   RBC 4.60 3.87 - 5.11 MIL/uL   Hemoglobin 12.3 12.0 - 15.0 g/dL   HCT 39.8 36.0 - 46.0 %   MCV 86.5 78.0 - 100.0 fL   MCH 26.7 26.0 - 34.0 pg   MCHC 30.9 30.0 - 36.0 g/dL   RDW 14.8 11.5 - 15.5 %   Platelets 326 150 - 400 K/uL    Comment: Performed at [facility]    Physical Findings: AIMS: Facial and Oral Movements Muscles of Facial Expression: None, normal Lips and Perioral Area: None, normal Jaw: None, normal Tongue: None, normal,Extremity Movements Upper (arms, wrists, hands, fingers): None, normal Lower (legs, knees, ankles, toes): None, normal, Trunk Movements Neck, shoulders, hips: None, normal, Overall Severity Severity of abnormal movements (highest score from questions above): None, normal Incapacitation due to abnormal movements: None, normal Patient's awareness of abnormal movements (rate only patient's report): No Awareness, Dental Status Current problems with teeth and/or dentures?: No Does patient usually wear dentures?: No  CIWA:  CIWA-Ar Total: 1 COWS:  COWS Total Score: 1  Musculoskeletal: Strength & Muscle Tone: within normal limits Gait & Station: normal Patient leans: N/A  Psychiatric Specialty  Exam: Review of Systems  Psychiatric/Behavioral: Positive for depression. Negative for suicidal ideas and hallucinations. The patient is nervous/anxious and has insomnia.   All other systems reviewed and are negative.   Blood pressure 108/53, pulse 89, temperature 98.1 F (36.7 C), temperature source Oral, resp. rate 16, height _0  (1.702 m), weight 145.151 kg (320 lb), last menstrual period 10/10/2015, SpO2 96 %.Body mass index is 50.11 kg/(m^2).  General Appearance: Casual and Fairly Groomed  Engineer, water::  Good  Speech:  Clear and Coherent and Normal Rate  Volume:  Normal  Mood:  Anxious and Depressed  Affect:  Appropriate and Congruent  Thought Process:  Coherent and Goal Directed  Orientation:  Full (Time, Place, and Person)  Thought Content:  WDL  Suicidal Thoughts:  No  Homicidal Thoughts:  No  Memory:  Immediate;   Fair Recent;   Fair Remote;   Fair  Judgement:  Fair  Insight:  Fair  Psychomotor Activity:  Normal  Concentration:  Fair  Recall:  [employer] [place]  Language: Fair  Akathisia:  No  Handed:    AIMS (if indicated):     Assets:  Communication Skills Desire for Improvement Physical Health Resilience Social Support  ADL's:  Intact  Cognition: WNL  Sleep:  Number of Hours: 6.5   Treatment Plan Summary: Daily contact with patient to assess and evaluate symptoms and progress in treatment and Medication management   Medications:  Continue Neurontin 461m bid Continue Lithium 3068mAM and 60053mid  Continue Nicotine patch Continue Trazodone 3m14mh prn insomnia  WithBenjamine MolaP-BC 10/20/2015, 12:37 PM  I reviewed chart and agreed with the findings and treatment Plan.  Berniece Andreas, MD

## 2015-10-20 NOTE — BHH Group Notes (Signed)
[facility] Group Notes:  (Clinical Social Work)  10/20/2015     10-11AM  Summary of Progress/Problems:   The main focus of today's process group was to learn how to use a decisional balance exercise to move forward in the Stages of Change.  Patients listed unhealthy coping techniques, then  Motivational Interviewing and the whiteboard were utilized to help patients explore in depth the perceived benefits and costs of unhealthy coping techniques, as well as the  benefits and costs of replacing that with a healthy coping skills.   The patient expressed that her own unhealthy coping involves using anything that can help her to "numb out" including smoking, relationships, and more.  She went to sleep with her head down on a table, participated both before and after sleep occurred.  Type of Therapy:  Group Therapy - Process   Participation Level:  Minimal  Participation Quality:  Attentive and Drowsy  Affect:  Blunted  Cognitive:  Appropriate  Insight:  Improving  Engagement in Therapy:  Improving  Modes of Intervention:  Education, Motivational Interviewing  Ambrose MantleMareida Grossman-Orr, LCSW 10/20/2015, 12:15 PM

## 2015-10-20 NOTE — Progress Notes (Signed)
D.  Pt pleasant on approach, denies complaints at this time.  Positive for evening AA group, interacting appropriately with peers on the unit.  Pt requests that prescription for Trazodone be given upon discharge.  Denies SI/HI/hallucinations at this time.  A.  Support and encouragement offered, medications given as ordered  R.  Pt remains safe on unit, will continue to monitor.

## 2015-10-20 NOTE — [facility] Group Notes (Signed)
[facility] Group Notes:  (Nursing/MHT/Case Management/Adjunct)  Date:  10/20/2015  Time:  11:08 AM  Type of Therapy:  Psychoeducational Skills  Participation Level:  Active  Participation Quality:  Appropriate  Affect:  Appropriate  Cognitive:  Appropriate  Insight:  Appropriate  Engagement in Group:  Engaged  Modes of Intervention:  Discussion  Summary of Progress/Problems: Pt did attend self inventory group, pt reported that she was negative SI/HI, no AH/VH noted. Pt rated her depression as a 3, and her helplessness/hopelessness as a 0.     Pt reported no issues or concerns.   Jacquelyne BalintForrest, Blakeley Scheier Shanta 10/20/2015, 11:08 AM

## 2015-10-20 NOTE — Progress Notes (Signed)
D Pt. Denies SI and HI at present time.  Pt. States she [facility] very [facility] but feels much better today.  Pt. Did sign the 72hr  Request on 10/18/2015 at 18:40pm.  Pt. Denies A and VH.  A Writer offered support and encouragement,  Discussed pt.'s day.  R Pt. Rates her day a 7 out of 10, her depression is a 3 and her anxiety a 6.  Pt. Remains safe on the unit .  Will continue to monitor to ensure pt. Safety.

## 2015-10-20 NOTE — Progress Notes (Signed)
Patient ID: Monica DoomsJasmin M Huffman, female   DOB: 1991-11-18, 24 y.o.   MRN: [id number]   D: Pt has been appropriate on the unit today, she is ready for discharge. Pt signed a 72 hour request for discharge on 10/18/15. Pt reported that she felt better, and that she likes the way she takes her medication at home. Pt reported that her depression was a 3, her hopelessness was a 3, and her anxiety was a 6. Pt reported that his goal for today was to work on a exit plan.  Pt reported being negative SI/HI, no AH/VH noted. A: 15 min checks continued for patient safety. R: Pt safety maintained.

## 2015-10-20 NOTE — [facility] Group Notes (Signed)
[facility] Group Notes:  (Nursing/MHT/Case Management/Adjunct)  Date:  10/20/2015  Time:  3:02 PM  Type of Therapy:  Psychoeducational Skills  Participation Level:  Active  Participation Quality:  Appropriate  Affect:  Appropriate  Cognitive:  Appropriate  Insight:  Appropriate  Engagement in Group:  Engaged  Modes of Intervention:  Discussion  Summary of Progress/Problems: Pt did attend healthy coping skills group.     Hila Bolding Shanta 10/20/2015, 3:02 PM 

## 2015-10-20 NOTE — Progress Notes (Signed)
Patient did attend the evening speaker AA meeting.  

## 2015-10-21 DIAGNOSIS — F3130 Bipolar disorder, current episode depressed, mild or moderate severity, unspecified: Principal | ICD-10-CM

## 2015-10-21 MED ORDER — GABAPENTIN 400 MG PO CAPS: 400.0000 mg | ORAL_CAPSULE | Freq: Two times a day (BID) | ORAL | Status: DC

## 2015-10-21 MED ORDER — LITHIUM CARBONATE ER 300 MG PO TBCR: 300.0000 mg | EXTENDED_RELEASE_TABLET | Freq: Every day | ORAL | Status: DC

## 2015-10-21 MED ORDER — TRAZODONE HCL 50 MG PO TABS: 50.0000 mg | ORAL_TABLET | Freq: Every evening | ORAL | Status: DC | PRN

## 2015-10-21 MED ORDER — NICOTINE 21 MG/24HR TD PT24: 21.0000 mg | MEDICATED_PATCH | Freq: Every day | TRANSDERMAL | Status: DC

## 2015-10-21 MED ORDER — LEVOTHYROXINE SODIUM 25 MCG PO TABS: 25.0000 ug | ORAL_TABLET | Freq: Every day | ORAL | Status: DC

## 2015-10-21 MED ORDER — LEVOTHYROXINE SODIUM 25 MCG PO TABS
25.0000 ug | ORAL_TABLET | Freq: Every day | ORAL | Status: DC
  Administered 2015-10-21: 25 ug via ORAL
  Filled 2015-10-21 (×2): qty 7
  Filled 2015-10-21: qty 1

## 2015-10-21 MED ORDER — LITHIUM CARBONATE ER 300 MG PO TBCR
600.0000 mg | EXTENDED_RELEASE_TABLET | Freq: Two times a day (BID) | ORAL | Status: DC
Start: 2015-10-21 — End: 2015-12-14

## 2015-10-21 NOTE — Progress Notes (Signed)
MD completed pt's DC SRA and DC order in pt chart. Pt completed daily assessment and on it shew wrote she denied SI and she rated her depression, hopelessness and anxiety " 0/0/3", respectively . She was given her DC AVS, its contents were reviewed with her and she was given sample meds as well as prescriptions for continuing medications. She stated she will keep her f/u appts and that she is " ready" to go today. All belongings were returned to her and she was escorted to bldg entrance and dc'd.

## 2015-10-21 NOTE — [facility] Suicide Risk Assessment (Signed)
[facility]   Demographic Factors:  Living alone  Total Time spent with patient: 30 minutes  Musculoskeletal: Strength & Muscle Tone: within normal limits Gait & Station: normal Patient leans: N/A  Psychiatric Specialty Exam: Physical Exam  ROS  Blood pressure 108/53, pulse 89, temperature 98.1 F (36.7 C), temperature source Oral, resp. rate 16, height 5\' 7"  (1.702 m), weight 145.151 kg (320 lb), last menstrual period 10/10/2015, SpO2 96 %.Body mass index is 50.11 kg/(m^2).  General Appearance: Casual  Eye Contact::  Good  Speech:  Normal Rate409  Volume:  Normal  Mood:  Anxious  Affect:  Appropriate  Thought Process:  Coherent  Orientation:  Full (Time, Place, and Person)  Thought Content:  WDL  Suicidal Thoughts:  No  Homicidal Thoughts:  No  Memory:  Immediate;   Good Recent;   Good Remote;   Good  Judgement:  Intact  Insight:  Good  Psychomotor Activity:  Normal  Concentration:  Fair  Recall:  [employer]Fair  Fund of Knowledge:Fair  Language: Fair  Akathisia:  No  Handed:  Right  AIMS (if indicated):     Assets:  Communication Skills Desire for Improvement Financial Resources/Insurance Housing Physical Health Resilience Social Support Transportation Vocational/Educational  Sleep:  Number of Hours: 5  Cognition: WNL  ADL's:  Intact   Have you used any form of tobacco in the last 30 days? (Cigarettes, Smokeless Tobacco, Cigars, and/or Pipes): Yes  Has this patient used any form of tobacco in the last 30 days? (Cigarettes, Smokeless Tobacco, Cigars, and/or Pipes) Yes, Prescription not provided because: Patient refused  Mental Status Per Nursing Assessment::   On Admission:     Current Mental Status by Physician: See above  Loss Factors: NA  Historical Factors: Prior suicide attempts  Risk Reduction Factors:   Sense of responsibility to family, Religious beliefs about death, Employed, Positive social support, Positive therapeutic  relationship and Positive coping skills or problem solving skills  Continued Clinical Symptoms:  Previous Psychiatric Diagnoses and Treatments  Cognitive Features That Contribute To Risk:  None    Suicide Risk:  Minimal: No identifiable suicidal ideation.  Patients presenting with no risk factors but with morbid ruminations; may be classified as minimal risk based on the severity of the depressive symptoms  Principal Problem: Bipolar I disorder, most recent episode depressed [facility](HCC) Discharge Diagnoses:  Patient Active Problem List   Diagnosis Date Noted  . PTSD (post-traumatic stress disorder) [F43.10] 10/19/2015  . Bipolar I disorder, most recent episode depressed (HCC) [F31.30] 10/19/2015  . GAD (generalized anxiety disorder) [F41.1] 10/18/2015    Follow-up Information    Follow up with [facility] On 10/29/2015.   Why:  Appt on this date at 1:40PM for medication management/hospital follow-up.    Contact information:   [address] [place], [place] [address] Phone: [phone] Fax: [phone]      Follow up with [facility].   Why:  Return to [facility] at discharge.    Contact information:   [address] [place], [place] [address] Phone: [phone] Fax: [phone]      Plan Of Care/Follow-up recommendations:  Activity:  As tolerated Diet:  Unchanged from the past  Is patient on multiple antipsychotic therapies at discharge:  No   Has Patient had three or more failed trials of antipsychotic monotherapy by history:  No  Recommended Plan for Multiple Antipsychotic Therapies: NA    Monica Huffman T. 10/21/2015, 9:03 AM
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
## 2015-10-21 NOTE — [facility] Group Notes (Signed)
[facility] Group Notes:  (Clinical Social Work)  10/21/2015  10:00-11:00AM  Summary of Progress/Problems:   The main focus of today's process group was to   1)  discuss the importance of adding supports  2)  define healthy supports versus unhealthy supports  3)  Discuss various healthy supports that can be put into place for various issues in life.  An emphasis was placed on using counselor, doctor, therapy groups, 12-step groups, and problem-specific support groups to expand supports.    The patient expressed full comprehension of the concepts presented, and agreed that there is a need to add more supports.  The patient stated she has learned a lot of coping skills from her counselor, and was encouraging to others, gave good feedback and suggestions.  Type of Therapy:  Process Group with Motivational Interviewing  Participation Level:  Active  Participation Quality:  Attentive, Sharing and Supportive  Affect:  Appropriate  Cognitive:  Alert, Appropriate and Oriented  Insight:  Engaged  Engagement in Therapy:  Engaged  Modes of Intervention:   Education, Teacher, English as a foreign languageupport and Processing, Activity  [facility]ilgrim[employer] PrideMareida Grossman-Orr, LCSW

## 2015-10-22 LAB — HEMOGLOBIN A1C
Hgb A1c MFr Bld: 5.5 % (ref 4.8–5.6)
Mean Plasma Glucose: 111 mg/dL

## 2015-12-13 ENCOUNTER — Inpatient Hospital Stay (HOSPITAL_COMMUNITY)
Admission: RE | Admit: 2015-12-13 | Discharge: 2015-12-17 | DRG: 881 | Disposition: A | Discharge status: Home / Self Care | Payer: Federal, State, Local not specified - Other | Attending: Psychiatry | Admitting: Psychiatry

## 2015-12-13 ENCOUNTER — Encounter (HOSPITAL_COMMUNITY): Payer: Self-pay

## 2015-12-13 DIAGNOSIS — F4310 Post-traumatic stress disorder, unspecified: Secondary | ICD-10-CM | POA: Diagnosis not present

## 2015-12-13 DIAGNOSIS — F319 Bipolar disorder, unspecified: Secondary | ICD-10-CM | POA: Diagnosis present

## 2015-12-13 DIAGNOSIS — F3130 Bipolar disorder, current episode depressed, mild or moderate severity, unspecified: Secondary | ICD-10-CM | POA: Diagnosis not present

## 2015-12-13 DIAGNOSIS — F329 Major depressive disorder, single episode, unspecified: Principal | ICD-10-CM | POA: Diagnosis present

## 2015-12-13 DIAGNOSIS — F411 Generalized anxiety disorder: Secondary | ICD-10-CM | POA: Diagnosis not present

## 2015-12-13 DIAGNOSIS — F17210 Nicotine dependence, cigarettes, uncomplicated: Secondary | ICD-10-CM | POA: Diagnosis present

## 2015-12-13 MED ORDER — GABAPENTIN 400 MG PO CAPS
400.0000 mg | ORAL_CAPSULE | Freq: Two times a day (BID) | ORAL | Status: DC
  Administered 2015-12-13 – 2015-12-16 (×10): 400 mg via ORAL
  Filled 2015-12-13 (×2): qty 1
  Filled 2015-12-13: qty 21
  Filled 2015-12-13 (×10): qty 1
  Filled 2015-12-13: qty 21
  Filled 2015-12-13 (×6): qty 1
  Filled 2015-12-13: qty 21

## 2015-12-13 MED ORDER — MAGNESIUM HYDROXIDE 400 MG/5ML PO SUSP: 30.0000 mL | Freq: Every day | ORAL | Status: DC | PRN

## 2015-12-13 MED ORDER — ALUM & MAG HYDROXIDE-SIMETH 200-200-20 MG/5ML PO SUSP: 30.0000 mL | ORAL | Status: DC | PRN

## 2015-12-13 MED ORDER — TRAZODONE HCL 50 MG PO TABS
50.0000 mg | ORAL_TABLET | Freq: Every evening | ORAL | Status: DC | PRN
  Administered 2015-12-13 (×2): 50 mg via ORAL
  Filled 2015-12-13 (×2): qty 1

## 2015-12-13 MED ORDER — NICOTINE 21 MG/24HR TD PT24
21.0000 mg | MEDICATED_PATCH | Freq: Every day | TRANSDERMAL | Status: DC
  Administered 2015-12-13 – 2015-12-17 (×5): 21 mg via TRANSDERMAL
  Filled 2015-12-13 (×6): qty 1

## 2015-12-13 MED ORDER — ACETAMINOPHEN 325 MG PO TABS
650.0000 mg | ORAL_TABLET | Freq: Four times a day (QID) | ORAL | Status: DC | PRN
Start: 2015-12-13 — End: 2015-12-17
  Administered 2015-12-15: 650 mg via ORAL
  Filled 2015-12-13: qty 2

## 2015-12-13 MED ORDER — LEVOTHYROXINE SODIUM 25 MCG PO TABS
25.0000 ug | ORAL_TABLET | Freq: Every day | ORAL | Status: DC
  Administered 2015-12-14 – 2015-12-17 (×4): 25 ug via ORAL
  Filled 2015-12-13: qty 7
  Filled 2015-12-13 (×6): qty 1

## 2015-12-13 NOTE — Plan of Care (Signed)
Problem: Diagnosis: Increased Risk For Suicide Attempt Goal: STG-Patient Will Attend All Groups On The Unit Outcome: Progressing Pt attended evening group on 12/13/15.

## 2015-12-13 NOTE — Progress Notes (Signed)
D: Pt has depressed affect and mood.  She reports her day was "okay" and that her goal tonight is to "sleep."  Pt reports she would like medication stabilization.  When asked if she was having thoughts of self-harm or suicide, pt states "the thoughts are still there, but I'm not going to do anything here."  Pt denies having a plan and she verbally contracts for safety.  Pt denies HI, denies pain.  She reports visual hallucinations, stating she has been "seeing stuff like dark shadows."  Pt has been visible in milieu interacting with peers and staff appropriately.  Pt attended evening group.   A: Introduced self to pt.  Met with pt 1:1 and provided support and encouragement.  Actively listened to pt.  Medications administered per order.  PRN medication administered for sleep. R: Pt is compliant with medications.  Pt verbally contracts for safety and reports that she will inform staff of her needs and concerns.  Will continue to monitor and assess.
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
## 2015-12-13 NOTE — Progress Notes (Signed)
Pt attended karaoke group this evening.  

## 2015-12-13 NOTE — Tx Team (Signed)
Initial Interdisciplinary Treatment Plan   PATIENT STRESSORS: Educational concerns Medication change or noncompliance3   PATIENT STRENGTHS: Ability for insight Active sense of humor Average or above average intelligence   PROBLEM LIST: Problem List/Patient Goals Date to be addressed Date deferred Reason deferred Estimated date of resolution  "hearing voices and seeing things."      Suicidal thoughts      "voices and stuff go away."      Financial stressors                                      DISCHARGE CRITERIA:  Ability to meet basic life and health needs Adequate post-discharge living arrangements Improved stabilization in mood, thinking, and/or behavior  PRELIMINARY DISCHARGE PLAN: Return to previous living arrangement  PATIENT/FAMIILY INVOLVEMENT: This treatment plan has been presented to and reviewed with the patient, Monica Huffman, Monica Huffman 12/13/2015, 6:04 PM

## 2015-12-14 ENCOUNTER — Encounter (HOSPITAL_COMMUNITY): Payer: Self-pay | Admitting: Psychiatry

## 2015-12-14 LAB — LITHIUM LEVEL: LITHIUM LVL: 0.07 mmol/L — AB (ref 0.60–1.20)

## 2015-12-14 LAB — PREGNANCY, URINE: Preg Test, Ur: NEGATIVE

## 2015-12-14 MED ORDER — ARIPIPRAZOLE 5 MG PO TABS
5.0000 mg | ORAL_TABLET | Freq: Every day | ORAL | Status: DC
  Administered 2015-12-14 – 2015-12-17 (×4): 5 mg via ORAL
  Filled 2015-12-14 (×6): qty 1
  Filled 2015-12-14: qty 7

## 2015-12-14 MED ORDER — TRAZODONE HCL 100 MG PO TABS
100.0000 mg | ORAL_TABLET | Freq: Every evening | ORAL | Status: DC | PRN
  Administered 2015-12-14 – 2015-12-16 (×3): 100 mg via ORAL
  Filled 2015-12-14: qty 1
  Filled 2015-12-14: qty 7
  Filled 2015-12-14 (×3): qty 1

## 2015-12-14 NOTE — Tx Team (Addendum)
Interdisciplinary Treatment Plan Update (Adult) Date: 12/14/2015    Time Reviewed: 9:30 AM  Progress in Treatment: Attending groups: Yes Participating in groups:Yes Taking medication as prescribed: Yes Tolerating medication: Yes Family/Significant other contact made: No, CSW assessing for appropriate contacts Patient understands diagnosis: Yes Discussing patient identified problems/goals with staff: Yes Medical problems stabilized or resolved: Yes Denies suicidal/homicidal ideation: Yes, patient denies SI Issues/concerns per patient self-inventory: Yes Other:  New problem(s) identified: N/A  Discharge Plan or Barriers: Patient plans to return to [facility] to follow up with outpatient services at [facility] or [facility].   Reason for Continuation of Hospitalization:  Depression Anxiety Medication Stabilization   Comments: N/A  Estimated length of stay: Discharge anticipated for 12/17/15   Patient is a 24 year old female admitted for SI with plan to overdose and AVH. Patient lives in [place] and plans to return at discharge. Patient will benefit from crisis stabilization, medication evaluation, group therapy, and psycho education in addition to case management for discharge planning. Patient and CSW reviewed pt's identified goals and treatment plan. Pt verbalized understanding and agreed to treatment plan.     Review of initial/current patient goals per problem list:  1. Goal(s): Patient will participate in aftercare plan   Met: Yes   Target date: 3-5 days post admission date   As evidenced by: Patient will participate within aftercare plan AEB aftercare provider and housing plan at discharge being identified.  12/16: Goal met. Patient plans to return to [place] to follow up with outpatient services.     2. Goal (s): Patient will exhibit decreased depressive symptoms and suicidal ideations.   Met: Yes   Target date: 3-5 days post admission  date   As evidenced by: Patient will utilize self rating of depression at 3 or below and demonstrate decreased signs of depression or be deemed stable for discharge by MD.   12/16: Goal not met: Pt presents with flat affect and depressed mood.  Pt with depression rating of 6 today.  Pt to show decreased sign of depression and a rating of 3 or less before d/c.    12/19: Goal met. Patient rates depression at 1 today, denies SI.    3. Goal(s): Patient will demonstrate decreased signs and symptoms of anxiety.   Met: Yes   Target date: 3-5 days post admission date   As evidenced by: Patient will utilize self rating of anxiety at 3 or below and demonstrated decreased signs of anxiety, or be deemed stable for discharge by MD  12/16: Goal not met: Pt presents with anxious mood and affect.  Pt with anxiety rating of 7 today.  Pt to show decreased sign of anxiety and a rating of 3 or less before d/c.  12/19: Goal met: Patient rates anxiety at 3 today.    5. Goal(s): Patient will demonstrate decreased signs of psychosis  * Met: Yes  * Target date: 3-5 days post admission date  * As evidenced by: Patient will demonstrate decreased frequency of AVH or return to baseline function  12/16: Goal not met: Pt to take medication as prescribed to decrease psychosis to baseline.   12/19: Goal met. Patient denies AVH     Attendees:  Patient:    Family:    Physician: Dr. Parke Poisson, Dr. Shea Evans, MD  12/14/2015 9:30 AM  Nursing: Drake Leach, Idell Pickles, Mayra Neer, RN  12/14/2015 9:30 AM  Clinical Social Worker Jahzion Brogden, Peri Maris, LCSWA; [place], LCSW 12/14/2015 9:30 AM  Other:  12/14/2015 9:30 AM  Clinical: Agustina Caroli, NP 12/14/2015 9:30 AM  Other: Lars Pinks, CM 12/14/2015 9:30 AM  Other:               Scribe for Treatment Team:  Tilden Fossa, MSW, [place] [phone]

## 2015-12-14 NOTE — Progress Notes (Signed)
Adult Psychoeducational Group Note  Date:  12/14/2015 Time:  8:53 PM  Group Topic/Focus:  Wrap-Up Group:   The focus of this group is to help patients review their daily goal of treatment and discuss progress on daily workbooks.  Participation Level:  Active  Participation Quality:  Appropriate  Affect:  Appropriate  Cognitive:  Alert  Insight: Appropriate  Engagement in Group:  Engaged  Modes of Intervention:  Discussion  Additional Comments:  Pt stated that she had a good day. Her goal for today was to talk with the doctor about adjusting her medications. Her goal for tomorrow is to work on discharge planning.   Kaleen OdeaCOOKE, Mariella Blackwelder R 12/14/2015, 8:53 PM

## 2015-12-14 NOTE — [facility] Group Notes (Addendum)
   [facility] LCSW Aftercare Discharge Planning Group Note  12/14/2015  8:45 AM   Participation Quality: Alert, Appropriate and Oriented  Mood/Affect: Depressed and Flat  Depression Rating: 6  Anxiety Rating: 7  Thoughts of Suicide: Pt endorses passive SI  Will you contract for safety? Yes  Current AVH: Pt denies  Plan for Discharge/Comments: Pt attended discharge planning group and actively participated in group. CSW provided pt with today's workbook. Patient plans to return to [facility] and follow up with outpatient services.   Transportation Means: Pt reports access to transportation  Supports: No supports mentioned at this time  Samuella BruinKristin Shelvy Perazzo, MSW, [employer]CS[employer] Clinical Social Worker [facility] [phone]

## 2015-12-14 NOTE — Progress Notes (Signed)
D: Patient is pleasant upon approach.  She is calm and cooperative.  Patient rates her depression as a 7; her hopelessness and anxiety as a 5.  She is observed in the day room interacting with her peers.  She is attending groups and participating in her treatment. A: Continue to monitor medication management and MD orders.

## 2015-12-14 NOTE — [facility] Group Notes (Signed)
[facility] LCSW Group Therapy 12/14/2015 1:15 PM Type of Therapy: Group Therapy Participation Level: Minimal  Participation Quality: Attentive  Affect: Appropriate  Cognitive: Alert and Oriented  Insight: Developing/Improving and Engaged  Engagement in Therapy: Developing/Improving and Engaged  Modes of Intervention: Clarification, Confrontation, Discussion, Education, Exploration, Limit-setting, Orientation, Problem-solving, Rapport Building, Dance movement psychotherapisteality Testing, Socialization and Support  Summary of Progress/Problems: The topic for today was feelings about relapse. Pt discussed what relapse prevention is to them and identified triggers that they are on the path to relapse. Pt processed their feeling towards relapse and was able to relate to peers. Pt discussed coping skills that can be used for relapse prevention. Patient did not participate in discussion despite CSW encouragement.    Monica BruinKristin Abrial Arrighi, MSW, [employer]CSWA Clinical Social Worker [facility] [phone]
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
## 2015-12-14 NOTE — H&P (Signed)
Psychiatric Admission Assessment Adult  Patient Identification: SAHMYA ARAI MRN:  [id number] Date of Evaluation:  12/14/2015 Chief Complaint:   " I have been hearing voices " Principal Diagnosis:  Bipolar Disorder Depressed, with psychotic symptoms Diagnosis:   Patient Active Problem List   Diagnosis Date Noted  . MDD (major depressive disorder) (HCC) [F32.9] 12/13/2015  . PTSD (post-traumatic stress disorder) [F43.10] 10/19/2015  . Bipolar I disorder, most recent episode depressed (HCC) [F31.30] 10/19/2015  . GAD (generalized anxiety disorder) [F41.1] 10/18/2015   History of Present Illness:: 24 year old single female, [facility] in [facility]   ( [facility] ). States she has been having auditory and visual  hallucinations over the last few weeks. States she hears voices telling her to kill herself and that she " cannot trust anyone ". Describes them as " someone yelling through a tunnel ". Also describes visual hallucinations- seeing " shadows that look like  Demons". Reports increased anxiety, depression, but stresses that at this time psychotic symptoms are major issue. Patient states she told her counselor about these symptoms so was brought to the hospital. At this time endorses feeling  Depressed, and endorses suicidal ideations, with thoughts of overdosing, and endorses neurovegetative symptoms as below States she has been compliant with medications. She has been on Neurontin, Lithium- states dose has been titrated up, and over the last two weeks on Haldol, which she feels is not helping her psychotic symptoms. Of note, admission Lithium level very low ( 0.07) , but patient states she has been compliant with it  Of note, has history of substance abuse but reports she has been sober x 2 years .  Associated Signs/Symptoms: Depression Symptoms:  depressed mood, anhedonia, insomnia, suicidal thoughts with specific plan, loss of energy/fatigue, decreased  appetite, low sense of self esteem (Hypo) Manic Symptoms:  Denies  Anxiety Symptoms:  No recent panic attacks, decreased anxiety since she has been on Neurontin  Psychotic Symptoms:  (+) hallucinations as above PTSD Symptoms: Reports history of PTSD symptoms stemming from childhood abuse, but states that these symptoms have tended to improve . Total Time spent with patient: 45 minutes  Past Psychiatric History:  States she has been diagnosed with " Bipolar Disorder, Borderline Personality Disorder, Anxiety, PTSD". Has had several psychiatric admissions since age 65. Last admission was about 6 weeks ago. At that time was diagnosed with Bipolar Disorder, Depressed, and was discharged on Neurontin, LiC03, Trazodone .  History of suicide attempts by overdosing , first time at age 39, last time in 2014. History of self cutting, but not over the last year. Reports prior history of hallucinatory experiences but not in several years. Follows up at [facility].   Risk to Self: Suicidal Ideation: Yes-Currently Present Suicidal Intent: Yes-Currently Present Is patient at risk for suicide?: Yes Suicidal Plan?: Yes-Currently Present Specify Current Suicidal Plan: To overdose Access to Means: Yes Specify Access to Suicidal Means: access to pills What has been your use of drugs/alcohol within the last 12 months?: past use of opiates and alcohol How many times?: 1 Other Self Harm Risks: NA Triggers for Past Attempts: None known Intentional Self Injurious Behavior: None Risk to Others: Homicidal Ideation: No Thoughts of Harm to Others: No Current Homicidal Intent: No Current Homicidal Plan: No Access to Homicidal Means: No Identified Victim: NA History of harm to others?: No Assessment of Violence: None Noted Violent Behavior Description: NA Does patient have access to weapons?: No Criminal Charges Pending?: No Does patient  have a court date: No Prior Inpatient Therapy: Prior Inpatient Therapy:  Yes Prior Therapy Dates: 2016 Prior Therapy Facilty/Provider(s): [facility] Reason for Treatment: Depression Prior Outpatient Therapy: Prior Outpatient Therapy: Yes Prior Therapy Dates: 2016 Prior Therapy Facilty/Provider(s): [facility] Reason for Treatment: Depression Does patient have an ACCT team?: No Does patient have Intensive In-House Services?  : No Does patient have [facility] services? : No Does patient have [facility] services?: No  Alcohol Screening: 1. How often do you have a drink containing alcohol?: Never 9. Have you or someone else been injured as a result of your drinking?: No 10. Has a relative or friend or a doctor or another health worker been concerned about your drinking or suggested you cut down?: No Alcohol Use Disorder Identification Test Final Score (AUDIT): 0 Brief Intervention: AUDIT score less than 7 or less-screening does not suggest unhealthy drinking-brief intervention not indicated Substance Abuse History in the last 12 months: history of alcohol abuse, opiate and cocaine abuse, but has been sober x 2 years . Consequences of Substance Abuse: No use of illicit drugs x 2 years  Previous Psychotropic Medications:  Most recently on Lithium, Neurontin, Trazodone and over the last two weeks Haldol . Psychological Evaluations:  No  Past Medical History:  Obesity, NKDA, smokes 1 PPD  Past Medical History  Diagnosis Date  . Bipolar disorder (HCC)   . Anxiety   . Depression    History reviewed. No pertinent past surgical history. Family History:  States she does not know her father or father's family history. Mother is alive, little contact, has two sisters , one brother  Family History  Problem Relation Age of Onset  . Ovarian cancer Mother   . Alcoholism Mother   . Drug abuse Mother   . Drug abuse Sister    Family Psychiatric  History:  States " everybody in my family abuses drugs and alcohol". An uncle  And an aunt committed suicide  Social History:  Single, no  children, lives in a Residential Setting ( [facility] ), denies legal issues, no SO at this time, she works part time, also enrolled in college . History  Alcohol Use No    Comment: Individual reports that she has been clean from alcohol since Septemeber 27,2014     History  Drug Use No    Comment: Pt reports that she has been clean from pills and cocaine since Septemeber 27, 2014    Social History   Social History  . Marital Status: Single    Spouse Name: N/A  . Number of Children: N/A  . Years of Education: N/A   Social History Main Topics  . Smoking status: Current Every Day Smoker -- 1.00 packs/day for 10 years    Types: Cigarettes  . Smokeless tobacco: None  . 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 Sexual Activity: Not Currently   Other Topics Concern  . None   Social History Narrative   Additional Social History:    Pain Medications: Pt denies Prescriptions: Lithium, Haldal, Nuerotin, Trazodon Over the Counter: Pt denies History of alcohol / drug use?: Yes Longest period of sobriety (when/how long): unknown Negative Consequences of Use: Financial, Legal, Personal relationships, Work / School Name of Substance 1: alcohol 1 - Age  of First Use: 17 1 - Amount (size/oz): unknown 1 - Frequency: unknown 1 - Duration: in recovery 1 - Last Use / Amount: 2 years ago Name of Substance 2: opiates 2 - Age of First Use: 17 2 - Amount (size/oz): unknown 2 - Frequency: unknown 2 - Duration: in recovery 2 - Last Use / Amount: 2 years ago  Allergies:  No Known Allergies Lab Results:  Results for orders placed or performed during the hospital encounter of 12/13/15 (from the past 48 hour(s))  Lithium level     Status: Abnormal   Collection Time: 12/14/15  6:27 AM  Result Value Ref  Range   Lithium Lvl 0.07 (L) 0.60 - 1.20 mmol/L    Comment: Performed at [facility]    Metabolic Disorder Labs:  Lab Results  Component Value Date   HGBA1C 5.5 10/20/2015   MPG 111 10/20/2015   No results found for: PROLACTIN Lab Results  Component Value Date   CHOL * 09/19/2008    205        ATP III CLASSIFICATION:  <200     mg/dL   Desirable  [id number]  mg/dL   Borderline High  >=956>=240    mg/dL   High   TRIG 213246* 08/65/784609/22/2009   HDL 21* 09/19/2008   CHOLHDL 9.8 09/19/2008   VLDL 49* 09/19/2008   LDLCALC * 09/19/2008    135        Total Cholesterol/HDL:CHD Risk Coronary Heart Disease Risk Table                     Men   Women  1/2 Average Risk   3.4   3.3    Current Medications: Current Facility-Administered Medications  Medication Dose Route Frequency Provider Last Rate Last Dose  . acetaminophen (TYLENOL) tablet 650 mg  650 mg Oral Q6H PRN Adonis BrookSheila Agustin, NP      . alum & mag hydroxide-simeth (MAALOX/MYLANTA) 200-200-20 MG/5ML suspension 30 mL  30 mL Oral Q4H PRN Adonis BrookSheila Agustin, NP      . gabapentin (NEURONTIN) capsule 400 mg  400 mg Oral BID BM & HS Adonis BrookSheila Agustin, NP   400 mg at 12/13/15 2138  . levothyroxine (SYNTHROID, LEVOTHROID) tablet 25 mcg  25 mcg Oral QAC breakfast Adonis BrookSheila Agustin, NP   25 mcg at 12/14/15 [phone]  . magnesium hydroxide (MILK OF MAGNESIA) suspension 30 mL  30 mL Oral Daily PRN Adonis BrookSheila Agustin, NP      . nicotine (NICODERM CQ - dosed in mg/24 hours) patch 21 mg  21 mg Transdermal Daily Adonis BrookSheila Agustin, NP   21 mg at 12/13/15 1705  . traZODone (DESYREL) tablet 50 mg  50 mg Oral QHS PRN Adonis BrookSheila Agustin, NP   50 mg at 12/13/15 2245   PTA Medications: Prescriptions prior to admission  Medication Sig Dispense Refill Last Dose  . gabapentin (NEURONTIN) 400 MG capsule Take 1 capsule (400 mg total) by mouth 3 (three) times daily - between meals and at bedtime. 90 capsule 0   . levothyroxine (SYNTHROID, LEVOTHROID) 25 MCG tablet Take 1 tablet  (25 mcg total) by mouth daily before breakfast. 30 tablet 0   . lithium carbonate (LITHOBID) 300 MG CR tablet Take 1 tablet (300 mg total) by mouth daily at 12 noon. 30 tablet 0   . lithium carbonate (LITHOBID) 300 MG CR tablet Take 2 tablets (600 mg total) by mouth every 12 (twelve) hours. 120 tablet 0   . nicotine (NICODERM CQ -  DOSED IN MG/24 HOURS) 21 mg/24hr patch Place 1 patch (21 mg total) onto the skin daily. 28 patch 0   . traZODone (DESYREL) 50 MG tablet Take 1 tablet (50 mg total) by mouth at bedtime as needed for sleep (May repeat). 30 tablet 0     Musculoskeletal: Strength & Muscle Tone: within normal limits Gait & Station: normal Patient leans: N/A  Psychiatric Specialty Exam: Physical Exam  Review of Systems  Constitutional: Negative.   HENT: Negative.   Eyes: Negative.   Respiratory: Negative.   Cardiovascular: Negative.   Gastrointestinal: Negative.   Genitourinary: Negative.   Musculoskeletal: Negative.   Skin: Negative.   Neurological: Negative for seizures.  Endo/Heme/Allergies: Negative.   Psychiatric/Behavioral: Positive for depression, suicidal ideas and hallucinations.  All other systems reviewed and are negative.   Blood pressure 111/61, pulse 92, temperature 97.8 F (36.6 C), temperature source Oral, resp. rate 16, height  (1.702 m), weight 345 lb (156.491 kg), SpO2 99 %.Body mass index is 54.02 kg/(m^2).  General Appearance: Fairly Groomed  Patent attorney::  Fair  Speech:  Normal Rate  Volume:  Normal  Mood:  depressed.  Affect:  Congruent and mildly constricted  Thought Process:  Intact and Linear  Orientation:  Full (Time, Place, and Person)  Thought Content:  (+) auditory and visual hallucinations as above, but at this time does not appear internally preoccuied. Thought process is linear .  Suicidal Thoughts:  Noat this time denies any suicidal ideations or any self injurious ideations, contracts for safety on unit   Homicidal Thoughts:  No   Memory:  recent and remote grossly intact   Judgement:  Fair  Insight:  Fair  Psychomotor Activity:  Normal  Concentration:  Good  Recall:  Good  Fund of Knowledge:Good  Language: Good  Akathisia:  Negative  Handed:  Right  AIMS (if indicated):     Assets:  Communication Skills Desire for Improvement Housing Resilience  ADL's:  Intact  Cognition: WNL  Sleep:        Treatment Plan Summary: Daily contact with patient to assess and evaluate symptoms and progress in treatment, Medication management, Plan inpatient admission and medications as below   Observation Level/Precautions:  15 minute checks  Laboratory:  We discussed obtaining TSH and other routine labs , but patient states " I already got my blood drawn, I don't want to be stuck any more. Maybe in a couple of days ".   Psychotherapy:  - supportive, milieu  Medications:  Patient does not want to restart Lithium- states " it is not working, and I don't like all the blood draws"  We discussed other options, to include Latuda, which she states was not helpful, Geodon, Abilify. Agrees to Abilify. Start Abilify at 5 mgrs QDAY initially - titrate if well tolerated   Consultations:  As needed   Discharge Concerns:  -   Estimated LOS: 5-6 days   Other:     I certify that inpatient services furnished can reasonably be expected to improve the patient's condition.   COBOS, FERNANDO 12/16/20168:20 AM

## 2015-12-14 NOTE — Plan of Care (Signed)
Problem: Diagnosis: Increased Risk For Suicide Attempt Goal: STG-Patient Will Comply With Medication Regime Outcome: Progressing Compliant with medication

## 2015-12-14 NOTE — Progress Notes (Signed)
Patient ID: Monica Huffman, female   DOB: 21-May-1991, 24 y.o.   MRN: [id number] D: Patient in dayroom on approach. Pt reports she had an episode of self harm but came to the dayroom to be around peers. Pt contracted to use coping skills and seek staff before acting on self harm behavior.  Pt reports she is tolerating medication well. Pt denies HI/AVH and pain. Pt attended and participated in evening AA group. Cooperative with assessment.   A: Met with pt 1:1. Medications administered as prescribed. Support and encouragement provided to engage in milieu. Pt encouraged to discuss feelings and come to staff with any question or concerns.   R: Patient remains safe and complaint with medications.

## 2015-12-14 NOTE — [facility] Suicide Risk Assessment (Signed)
[facility] INPATIENT:  Family/Significant Other Suicide Prevention Education  Suicide Prevention Education:  Patient Refusal for Family/Significant Other Suicide Prevention Education: The patient Monica Huffman has refused to provide written consent for family/significant other to be provided Family/Significant Other Suicide Prevention Education during admission and/or prior to discharge.  Physician notified. SPE reviewed with patient and brochure provided. Patient encouraged to return to hospital if having suicidal thoughts, patient verbalized his/her understanding and has no further questions at this time.   Sayer Masini, West CarboKristin L 12/14/2015, 11:19 AM

## 2015-12-14 NOTE — Progress Notes (Signed)
CSW received call from patient's counselor [facility] [phone] who reported that their agency is [facility]. Per Rosey Batheresa, patient has had 3 hospitalizations within the past 6 months and that her symptoms including AVH, mania, and anxiety have worsened within the past 6 weeks. Rosey Batheresa reports that patient can return to [facility] if stabilized and [facility] is not an options. CSW spoke with MD to relay Teresa's concerns. It is unclear if patient will need such a high level of care at discharge as it is expected that she will stabilize within several days. CSW will continue to follow.   Samuella BruinKristin Ifeanyichukwu Wickham, MSW, [employer] Clinical Social Worker [facility] [phone]

## 2015-12-15 DIAGNOSIS — F3130 Bipolar disorder, current episode depressed, mild or moderate severity, unspecified: Secondary | ICD-10-CM
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
## 2015-12-15 NOTE — Progress Notes (Signed)
[facility] MD Progress Note  12/15/2015 11:45 AM Monica Huffman  MRN:  [id number] Subjective:  Hearing voices some Principal Problem: Bipolar I disorder, most recent episode depressed (HCC) Diagnosis:   Patient Active Problem List   Diagnosis Date Noted  . MDD (major depressive disorder) (HCC) [F32.9] 12/13/2015  . PTSD (post-traumatic stress disorder) [F43.10] 10/19/2015  . Bipolar I disorder, most recent episode depressed (HCC) [F31.30] 10/19/2015  . GAD (generalized anxiety disorder) [F41.1] 10/18/2015   Total Time spent with patient: 30 minutes  Past Psychiatric History: 24 year old single female, lives in a [facility] ( [facility] ). Initially presented with hearing voices like "one yelling through a tunnel ". Also described visual hallucinations- seeing " shadows that look like Demons". Reported anxiety depression. Counsellor suggested for her to get admitted. Has been on lithium but level was low. Has been on haldol as well.  On admission to unit started on abilify.  On evaluation today feels abilify has helped some. Less voices. Still feels blah but not hopleless. No tremors noticeable  Anxiety not worsened. Sleep adequate Psychosis; some voices   Past Medical History:  Past Medical History  Diagnosis Date  . Bipolar disorder (HCC)   . Anxiety   . Depression    History reviewed. No pertinent past surgical history. Family History:  Family History  Problem Relation Age of Onset  . Ovarian cancer Mother   . Alcoholism Mother   . Drug abuse Mother   . 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 Marital Status: Single    Spouse Name: N/A   . Number of Children: N/A  . Years of Education: N/A   Social History Main Topics  . Smoking status: Current Every Day Smoker -- 1.00 packs/day for 10 years    Types: Cigarettes  . Smokeless tobacco: None  . 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 Sexual Activity: Not Currently   Other Topics Concern  . None   Social History Narrative   Additional Social History:    Pain Medications: Pt denies Prescriptions: Lithium, Haldal, Nuerotin, Trazodon Over the Counter: Pt denies History of alcohol / drug use?: Yes Longest period of sobriety (when/how long): unknown Negative Consequences of Use: Financial, Legal, Personal relationships, Work / School Name of Substance 1: alcohol 1 - Age of First Use: 17 1 - Amount (size/oz): unknown 1 - Frequency: unknown 1 - Duration: in recovery 1 - Last Use / Amount: 2 years ago Name of Substance 2: opiates 2 - Age of First Use: 17 2 - Amount (size/oz): unknown 2 - Frequency: unknown 2 - Duration: in recovery 2 - Last Use / Amount: 2 years ago                Sleep: Fair  Appetite:  Fair  Current Medications: Current Facility-Administered Medications  Medication Dose Route Frequency Provider  Last Rate Last Dose  . acetaminophen (TYLENOL) tablet 650 mg  650 mg Oral Q6H PRN Adonis BrookSheila Agustin, NP   650 mg at 12/15/15 [phone]  . alum & mag hydroxide-simeth (MAALOX/MYLANTA) 200-200-20 MG/5ML suspension 30 mL  30 mL Oral Q4H PRN Adonis BrookSheila Agustin, NP      . ARIPiprazole (ABILIFY) tablet 5 mg  5 mg Oral Daily Craige CottaFernando A Cobos, MD   5 mg at 12/15/15 0829  . gabapentin (NEURONTIN) capsule 400 mg  400 mg Oral BID BM & HS Adonis BrookSheila Agustin, NP   400 mg at 12/15/15 0829  . levothyroxine (SYNTHROID, LEVOTHROID) tablet 25 mcg  25 mcg Oral QAC breakfast Adonis BrookSheila Agustin, NP    25 mcg at 12/15/15 [phone]  . magnesium hydroxide (MILK OF MAGNESIA) suspension 30 mL  30 mL Oral Daily PRN Adonis BrookSheila Agustin, NP      . nicotine (NICODERM CQ - dosed in mg/24 hours) patch 21 mg  21 mg Transdermal Daily Adonis BrookSheila Agustin, NP   21 mg at 12/15/15 0830  . traZODone (DESYREL) tablet 100 mg  100 mg Oral QHS PRN Craige CottaFernando A Cobos, MD   100 mg at 12/14/15 2108    Lab Results:  Results for orders placed or performed during the hospital encounter of 12/13/15 (from the past 48 hour(s))  Lithium level     Status: Abnormal   Collection Time: 12/14/15  6:27 AM  Result Value Ref Range   Lithium Lvl 0.07 (L) 0.60 - 1.20 mmol/L    Comment: [facility]  Pregnancy, urine     Status: None   Collection Time: 12/14/15 10:46 AM  Result Value Ref Range   Preg Test, Ur NEGATIVE NEGATIVE    Comment:        THE SENSITIVITY OF THIS METHODOLOGY IS >20 mIU/mL. [facility]     Physical Findings: AIMS: Facial and Oral Movements Muscles of Facial Expression: None, normal Lips and Perioral Area: None, normal Jaw: None, normal Tongue: None, normal,Extremity Movements Upper (arms, wrists, hands, fingers): None, normal Lower (legs, knees, ankles, toes): None, normal, Trunk Movements Neck, shoulders, hips: None, normal, Overall Severity Severity of abnormal movements (highest score from questions above): None, normal Incapacitation due to abnormal movements: None, normal Patient's awareness of abnormal movements (rate only patient's report): No Awareness, Dental Status Current problems with teeth and/or dentures?: No Does patient usually wear dentures?: No  CIWA:    COWS:     Musculoskeletal: Strength & Muscle Tone: within normal limits Gait & Station: normal Patient leans: N/A  Psychiatric Specialty Exam: Review of Systems  Constitutional: Negative.   Cardiovascular: Negative for chest pain.  Skin: Negative for rash.   Psychiatric/Behavioral: Positive for depression.    Blood pressure 109/63, pulse 103, temperature 97.7 F (36.5 C), temperature source Oral, resp. rate 16, height 5\' 7"  (1.702 m), weight 156.491 kg (345 lb), SpO2 99 %.Body mass index is 54.02 kg/(m^2).  General Appearance: Casual  Eye Contact::  Fair  Speech:  Slow  Volume:  Decreased  Mood:  Dysphoric  Affect:  Congruent  Thought Process:  Coherent  Orientation:  Full (Time, Place, and Person)  Thought Content:  Hallucinations: Auditory, Paranoid Ideation and Rumination  Suicidal Thoughts:  No  Homicidal Thoughts:  No  Memory:  Immediate;   Fair Recent;   Fair  Judgement:  Poor  Insight:  Shallow  Psychomotor Activity:  Normal  Concentration:  Fair  Recall:  [employer]Fair  Fund of Knowledge:Fair  Language: Fair  Akathisia:  Negative  Handed:  Right  AIMS (if indicated):     Assets:  Desire for Improvement  ADL's:  Intact  Cognition: WNL  Sleep:  Number of Hours: 5.75   Treatment Plan Summary: Daily contact with patient to assess and evaluate symptoms and progress in treatment, Medication management and Plan as follows   MDD or Bipolar with psychotic features: will continue abilify. Consider to increase tomorrow if needed. Supportive therapy and medication compliance. Discharge planning within 3 to 5 days.    Apryl Brymer 12/15/2015, 11:45 AM

## 2015-12-15 NOTE — Progress Notes (Signed)
D) Pt states she didn't sleep last night much at all and wanted to sleep today. When encouraged to stay up so that she wouldn't set up a pattern, Pt was not keen on the idea and went back to bed. Affect is flat and mood is depressed. Tends to isolate and not interact much with her peers. Rates her depression at a 5, her hopelessness at a 6 and her anxiety at a 1. Denies SI and HI. A) Pt Given support and encouragement to stay up and attend the groups. Provided with a brief 1:1.  R) Denies SI and HI.

## 2015-12-15 NOTE — Progress Notes (Signed)
Adult Psychoeducational Group Note  Date:  12/15/2015 Time:  8:51 PM  Group Topic/Focus:  Wrap-Up Group:   The focus of this group is to help patients review their daily goal of treatment and discuss progress on daily workbooks.  Participation Level:  Active  Participation Quality:  Appropriate  Affect:  Appropriate  Cognitive:  Alert  Insight: Appropriate  Engagement in Group:  Engaged  Modes of Intervention:  Discussion  Additional Comments:  Pt participated in a group game.  Kaleen OdeaCOOKE, Sheena Simonis R 12/15/2015, 8:51 PM

## 2015-12-16 MED ORDER — HYDROXYZINE HCL 25 MG PO TABS
25.0000 mg | ORAL_TABLET | Freq: Three times a day (TID) | ORAL | Status: DC | PRN
  Administered 2015-12-16: 25 mg via ORAL
  Filled 2015-12-16: qty 10
  Filled 2015-12-16: qty 1

## 2015-12-16 NOTE — Progress Notes (Signed)
Patient ID: Monica DoomsJasmin M Huffman, female   DOB: July 15, 1991, 24 y.o.   Huff[facility] [id number] [facility]  12/16/2015 11:56 AM Monica Olegario ShearerM Doerner  Huffman:  [id number] Subjective:  Hearing voices some Principal Problem: Bipolar I disorder, most recent episode depressed (HCC) Diagnosis:   Patient Active Problem List   Diagnosis Date Noted  . MDD (major depressive disorder) (HCC) [F32.9] 12/13/2015  . PTSD (post-traumatic stress disorder) [F43.10] 10/19/2015  . Bipolar I disorder, most recent episode depressed (HCC) [F31.30] 10/19/2015  . GAD (generalized anxiety disorder) [F41.1] 10/18/2015   Total Time spent with patient: 30 minutes  Past Psychiatric History: 24 year old single female, lives in a [facility] ( [facility] ). Initially presented with hearing voices like "one yelling through a tunnel ". Also described visual hallucinations- seeing " shadows that look like Demons". Reported anxiety depression. Counsellor suggested for her to get admitted. Has been on lithium but level was low. Has been on haldol as well.  On admission to unit started on abilify.   On evaluation today feels abilify has helped some. Less voices and she feels not paranoid. Attending groups and affect is less constricted. Not feeling blah today.   Anxiety baseline and not worsened.  Sleep adequate Psychosis; some voices but distant    Past Medical History:  Past Medical History  Diagnosis Date  . Bipolar disorder (HCC)   . Anxiety   . Depression    History reviewed. No pertinent past surgical history. Family History:  Family History  Problem Relation Age of Onset  . Ovarian cancer Mother   . Alcoholism Mother   . Drug abuse Mother   . 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 Marital Status: Single    Spouse Name: N/A  . Number of Children: N/A  . Years of Education: N/A   Social History Main Topics  . Smoking status: Current Every Day Smoker -- 1.00 packs/day for 10 years    Types: Cigarettes  . Smokeless tobacco: None  . 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 Sexual Activity: Not Currently   Other Topics Concern  . None   Social History Narrative   Additional Social History:    Pain Medications: Pt denies Prescriptions: Lithium, Haldal, Nuerotin, Trazodon Over the Counter: Pt denies History of alcohol / drug use?: Yes Longest period of sobriety (when/how long): unknown Negative Consequences of Use: Financial, Legal, Personal relationships, Work / School Name of Substance 1: alcohol 1 - Age of First Use: 17 1 - Amount (size/oz): unknown 1 - Frequency: unknown 1 - Duration: in recovery 1 - Last Use / Amount: 2 years ago Name of Substance 2: opiates 2 - Age of First Use: 17 2 - Amount (size/oz): unknown 2 - Frequency: unknown 2 - Duration: in recovery 2 - Last Use / Amount: 2 years ago  Sleep: Fair  Appetite:  Fair  Current Medications: Current Facility-Administered Medications  Medication Dose Route Frequency Provider Last Rate Last Dose  . acetaminophen (TYLENOL) tablet 650 mg  650 mg Oral Q6H PRN Adonis Brook, NP   650 mg at 12/15/15 [phone]  . alum & mag hydroxide-simeth (MAALOX/MYLANTA) 200-200-20 MG/5ML suspension 30 mL  30 mL Oral Q4H PRN Adonis Brook, NP      . ARIPiprazole (ABILIFY) tablet 5 mg  5 mg Oral Daily Craige Cotta, MD   5 mg at 12/16/15 0901  . gabapentin (NEURONTIN) capsule 400 mg  400 mg Oral BID BM & HS  Adonis Brook, NP   400 mg at 12/16/15 0903  . levothyroxine (SYNTHROID, LEVOTHROID) tablet 25 mcg  25 mcg Oral QAC breakfast Adonis Brook, NP   25 mcg at 12/16/15 [phone]  . magnesium hydroxide (MILK OF MAGNESIA) suspension 30 mL  30 mL Oral Daily PRN Adonis Brook, NP      . nicotine (NICODERM CQ - dosed in mg/24 hours) patch 21 mg  21 mg Transdermal Daily Adonis Brook, NP   21 mg at 12/16/15 0903  . traZODone (DESYREL) tablet 100 mg  100 mg Oral QHS PRN Craige Cotta, MD   100 mg at 12/15/15 2132    Lab Results:  No results found for this or any previous visit (from the past 48 hour(s)).  Physical Findings: AIMS: Facial and Oral Movements Muscles of Facial Expression: None, normal Lips and Perioral Area: None, normal Jaw: None, normal Tongue: None, normal,Extremity Movements Upper (arms, wrists, hands, fingers): None, normal Lower (legs, knees, ankles, toes): None, normal, Trunk Movements Neck, shoulders, hips: None, normal, Overall Severity Severity of abnormal movements (highest score from questions above): None, normal Incapacitation due to abnormal movements: None, normal Patient's awareness of abnormal movements (rate only patient's report): No Awareness, Dental Status Current problems with teeth and/or dentures?: No Does patient usually wear dentures?: No  CIWA:    COWS:     Musculoskeletal: Strength & Muscle Tone: within normal limits Gait & Station: normal Patient leans: N/A  Psychiatric Specialty Exam: Review of Systems  Constitutional: Negative.   Cardiovascular: Negative for chest pain.  Skin: Negative for rash.  Neurological: Negative for tremors.  Psychiatric/Behavioral: Positive for depression.    Blood pressure 143/104, pulse 92, temperature 98 F (36.7 C), temperature source Oral, resp. rate 16, height  (1.702 m), weight 156.491 kg (345 lb), SpO2 99 %.Body mass index is 54.02 kg/(m^2).  General Appearance: Casual  Eye Contact::  Fair  Speech:   Slow  Volume:  Decreased  Mood:  Less dysphoric.   Affect: more reactive  Thought Process:  Coherent  Orientation:  Full (Time, Place, and Person)  Thought Content:  Hallucinations: Auditory, Paranoid Ideation and Rumination  Suicidal Thoughts:  No  Homicidal Thoughts:  No  Memory:  Immediate;   Fair Recent;   Fair  Judgement:  Poor  Insight:  Shallow  Psychomotor Activity:  Normal  Concentration:  Fair  Recall:  [employer] of Knowledge:Fair  Language: Fair  Akathisia:  Negative  Handed:  Right  AIMS (if indicated):     Assets:  Desire for Improvement  ADL's:  Intact  Cognition: WNL  Sleep:  Number of Hours: 6   Treatment Plan Summary: Daily contact with patient to assess and evaluate symptoms and progress in treatment, Medication management and Plan as follows   MDD or Bipolar with psychotic features: will continue abilify. Consider to increase if  needed. As of now mood has improved.  Supportive therapy and medication compliance. Discharge planning by Monday or Tuesday.Gilmore Laroche, Chara Marquard 12/16/2015, 11:56 AM

## 2015-12-16 NOTE — [facility] Group Notes (Addendum)
[facility] Group Notes:  (Nursing/MHT/Case Management/Adjunct)  Date:  12/15/2015  Time: 1000    Type of Therapy:  Nurse Education /  Identifying Needs: The group is focused on teaching patients how to identify their needs as well as identfy the behaviors required to get their needs met.  Participation Level:  Minimal  Participation Quality:  Appropriate  Affect:  Anxious  Cognitive:  Alert  Insight:  Appropriate  Engagement in Group:  Engaged  Modes of Intervention:  Education  Summary of Progress/Problems:  Monica Huffman Late entry for 12/15/2015

## 2015-12-16 NOTE — Progress Notes (Signed)
D Korie is seen at the med window this morning. Her hair is unkempt and her clothes are wrinkled and she says " I just got OOB". She completes her edaily assessment and on it she wrote she deneid SI and she rated her depression, hopelessness and anxiety  " 2/0/3", respectively.   A She is flat and depressed. She says she did not sleep last night and she is encouraged by this Clinical research associatewriter to speak to the practitioner about this.. She attends her groups.   R Safety is in place and poc contd.

## 2015-12-16 NOTE — Progress Notes (Signed)
Adult Psychoeducational Group Note  Date:  12/16/2015 Time:  9:05 PM  Group Topic/Focus:  Wrap-Up Group:   The focus of this group is to help patients review their daily goal of treatment and discuss progress on daily workbooks.  Participation Level:  Active  Participation Quality:  Appropriate and Attentive  Affect:  Appropriate  Cognitive:  Appropriate  Insight: Appropriate and Good  Engagement in Group:  Engaged  Modes of Intervention:  Education  Additional Comments:  Pt mentioned she was irritable and anxious throughout the day. Patient goal for tomorrow is to discharge. Pt plans to visit family services as a walk in for treatment. Pt goal is to get outside help such as a therapist and psychiatrist she can visit weekly or monthly for support to keep from having a relapse.   Merlinda FrederickKeshia S Jashawn Floyd 12/16/2015, 9:05 PM

## 2015-12-16 NOTE — Progress Notes (Signed)
D. Pt had been up and visible in milieu this evening, did attend and participate in group activity. Pt spoke about on-going depression but able to contract for safety while in the hospital. Pt seen interacting appropriately with peers in the milieu. Pt did receive bedtime medications without incident and did not verbalize any complaints of pain. A. Support and encouragement provided. R. Safety maintained, will continue to monitor.

## 2015-12-16 NOTE — [facility] Group Notes (Signed)
[facility] Group Notes:  (Clinical Social Work)   12/16/2015    1:15-2:15PM  Summary of Progress/Problems:   The main focus of today's process group was to   1)  Define health supports versus unhealthy supports  2)  Identify the areas of life in which we give and receive support;      3)  Discuss the importance of adding more supports; and  4) Identify supports that could be added to help with each of those areas.  An emphasis was placed on using counselor, doctor, therapy groups, 12-step groups, and problem-specific support groups to expand supports.    The patient expressed full comprehension of the concepts presented, and agreed that there is a need to add more supports.  The patient was very involved in the group topic, gave appropriate feedback to other patients.  She especially identified with the idea that people can make decisions on how to allocate their outgoing efforts/energy through the exercise done in group that can show the equitability of what they are doing currently.  Type of Therapy:  Process Group with Motivational Interviewing  Participation Level:  Active  Participation Quality:  Attentive, Sharing and Supportive  Affect:  Blunted  Cognitive:  Appropriate and Oriented  Insight:  Engaged  Engagement in Therapy:  Engaged  Modes of Intervention:   Education, Support and Processing, Activity  Ambrose MantleMareida Grossman-Orr, LCSW 12/16/2015   3:15 PM

## 2015-12-17 DIAGNOSIS — F411 Generalized anxiety disorder: Secondary | ICD-10-CM

## 2015-12-17 DIAGNOSIS — F4310 Post-traumatic stress disorder, unspecified: Secondary | ICD-10-CM

## 2015-12-17 MED ORDER — HYDROXYZINE HCL 25 MG PO TABS: 25.0000 mg | ORAL_TABLET | Freq: Three times a day (TID) | ORAL | Status: DC | PRN

## 2015-12-17 MED ORDER — ARIPIPRAZOLE 5 MG PO TABS
5.0000 mg | ORAL_TABLET | Freq: Every day | ORAL | Status: DC
Start: 2015-12-17 — End: 2020-07-31

## 2015-12-17 MED ORDER — LEVOTHYROXINE SODIUM 25 MCG PO TABS: 25.0000 ug | ORAL_TABLET | Freq: Every day | ORAL | Status: DC

## 2015-12-17 MED ORDER — GABAPENTIN 400 MG PO CAPS: 400.0000 mg | ORAL_CAPSULE | Freq: Two times a day (BID) | ORAL | Status: DC

## 2015-12-17 MED ORDER — TRAZODONE HCL 100 MG PO TABS: 100.0000 mg | ORAL_TABLET | Freq: Every day | ORAL | Status: DC

## 2015-12-17 MED ORDER — NICOTINE 21 MG/24HR TD PT24: 21.0000 mg | MEDICATED_PATCH | Freq: Every day | TRANSDERMAL | Status: DC

## 2015-12-17 NOTE — BHH Suicide Risk Assessment (Signed)
[facility]   Demographic Factors:  Caucasian  Total Time spent with patient: 30 minutes  Musculoskeletal: Strength & Muscle Tone: within normal limits Gait & Station: normal Patient leans: N/A  Psychiatric Specialty Exam: Physical Exam  Review of Systems  Psychiatric/Behavioral: Negative for depression and suicidal ideas. The patient is nervous/anxious. The patient does not have insomnia.   All other systems reviewed and are negative.   Blood pressure 109/58, pulse 82, temperature 97.9 F (36.6 C), temperature source Oral, resp. rate 16, height 5\' 7"  (1.702 m), weight 156.491 kg (345 lb), SpO2 99 %.Body mass index is 54.02 kg/(m^2).  General Appearance: Casual  Eye Contact::  Fair  Speech:  Clear and Coherent409  Volume:  Normal  Mood:  Anxious IMPROVING  Affect:  Congruent  Thought Process:  Goal Directed  Orientation:  Full (Time, Place, and Person)  Thought Content:  WDL  Suicidal Thoughts:  No  Homicidal Thoughts:  No  Memory:  Immediate;   Fair Recent;   Fair Remote;   Fair  Judgement:  Fair  Insight:  Fair  Psychomotor Activity:  Normal  Concentration:  Fair  Recall:  [employer]is[employer]Fair  Fund of Knowledge:Fair  Language: Fair  Akathisia:  No  Handed:  Right  AIMS (if indicated):     Assets:  Desire for Improvement  Sleep:  Number of Hours: 6  Cognition: WNL  ADL's:  Intact   Have you used any form of tobacco in the last 30 days? (Cigarettes, Smokeless Tobacco, Cigars, and/or Pipes): Yes  Has this patient used any form of tobacco in the last 30 days? (Cigarettes, Smokeless Tobacco, Cigars, and/or Pipes) Yes, A prescription for an FDA-approved tobacco cessation medication was offered at discharge and the patient refused  Mental Status Per Nursing Assessment::   On Admission:  Suicidal ideation indicated by patient  Current Mental Status by Physician: pt denies SI/HI/AH/VH  Loss Factors: NA  Historical Factors: Impulsivity  Risk  Reduction Factors:   Positive social support  Continued Clinical Symptoms:  Previous Psychiatric Diagnoses and Treatments  Cognitive Features That Contribute To Risk:  Polarized thinking    Suicide Risk:  Minimal: No identifiable suicidal ideation.  Patients presenting with no risk factors but with morbid ruminations; may be classified as minimal risk based on the severity of the depressive symptoms  Principal Problem: Bipolar I disorder, most recent episode depressed [facility](HCC) Discharge Diagnoses:  Patient Active Problem List   Diagnosis Date Noted  . PTSD (post-traumatic stress disorder) [F43.10] 10/19/2015  . Bipolar I disorder, most recent episode depressed (HCC) [F31.30] 10/19/2015  . GAD (generalized anxiety disorder) [F41.1] 10/18/2015    Follow-up Information    [facility].   Why:  Walk-in clinic for assessment for medication management services Monday-Friday between 8:30am - 12pm &  2pm- 3:30pm.    Contact information:   [address] [place] [address] [phone] Fax [phone]) [phone]      Plan Of Care/Follow-up recommendations:  Activity:  No restrictions Diet:  regular Tests:  as needed Other:  follow up with after care  Is patient on multiple antipsychotic therapies at discharge:  No   Has Patient had three or more failed trials of antipsychotic monotherapy by history:  No  Recommended Plan for Multiple Antipsychotic Therapies: NA    Meilah Delrosario MD 12/17/2015, 9:51 AM

## 2015-12-17 NOTE — BHH Group Notes (Signed)
   [facility]  12/17/2015  8:45 AM   Participation Quality: Alert, Appropriate and Oriented  Mood/Affect: anxious, tearful  Depression Rating: 1  Anxiety Rating: 3  Thoughts of Suicide: Pt denies SI/HI  Will you contract for safety? Yes  Current AVH: Pt denies  Plan for Discharge/Comments: Pt attended discharge planning group and actively participated in group. CSW provided pt with today's workbook. Patient reports feeling frustrated as she was hoping to discharge early this morning back to [facility]. CSW validated patient's frustration and apologized for miscommunication but agreed to share her concerns and hopes to discharge today with treatment team.   Transportation Means: Pt reports access to transportation  Supports: No supports mentioned at this time  Samuella BruinKristin Granger Chui, MSW, [employer] IncLCSWA Clinical Social Worker [facility] [phone]

## 2015-12-17 NOTE — Progress Notes (Signed)
  [facility] :  Will you be returning to the same living situation after discharge:  Yes,  patient plans to return to [facility] At discharge, do you have transportation home?: Yes,  patient reports transportation by friend Do you have the ability to pay for your medications: Yes,  patient will be provided with prescriptions at discharge  Release of information consent forms completed and in the chart;  Patient's signature needed at discharge.  Patient to Follow up at: Follow-up Information    [facility].   Why:  Walk-in clinic for assessment for medication management services Monday-Friday between 8:30am - 12pm &  2pm- 3:30pm.    Contact information:   [address] [place] [address] [phone] Fax [phone]      Next level of care provider has [facility] Link:no  Safety Planning and Suicide Prevention discussed: Yes,  with patient  Have you used any form of tobacco in the last 30 days? (Cigarettes, Smokeless Tobacco, Cigars, and/or Pipes): Yes  Has patient been referred to the Quitline?: Patient refused referral  Patient has been referred for addiction treatment: Yes- currently resides at [facility]  Monica Huffman, Monica Huffman 12/17/2015, 9:45 AM

## 2015-12-17 NOTE — Progress Notes (Signed)
Discharge note: Pt received both written and verbal discharge instructions. Pt verbalized understanding of discharge instructions. Pt agreed to f/u appt and med regimen. Pt received sample meds, prescriptions and belongings. Pt safely discharged to the lobby. 

## 2015-12-17 NOTE — Discharge Summary (Signed)
Physician Discharge Summary Note  Patient:  Monica Huffman is an 24 y.o., female MRN:  [id number] DOB:  1991-12-04 Patient phone:  [phone] (home)  Patient address:   [address] [place] [address],   Total Time spent with patient: Greater than 30 minutes  Date of Admission:  12/13/2015  Date of Discharge: 12-17-15  Reason for Admission: Worsening symptoms of Bipolar disorder  Principal Problem: Bipolar I disorder, most recent episode depressed [place])  Discharge Diagnoses: Patient Active Problem List   Diagnosis Date Noted  . MDD (major depressive disorder) (HCC) [F32.9] 12/13/2015  . PTSD (post-traumatic stress disorder) [F43.10] 10/19/2015  . Bipolar I disorder, most recent episode depressed (HCC) [F31.30] 10/19/2015  . GAD (generalized anxiety disorder) [F41.1] 10/18/2015   Past Psychiatric History: Bipolar affective disorder  Past Medical History:  Past Medical History  Diagnosis Date  . Bipolar disorder (HCC)   . Anxiety   . Depression    History reviewed. No pertinent past surgical history.  Family History:  Family History  Problem Relation Age of Onset  . Ovarian cancer Mother   . Alcoholism Mother   . Drug abuse Mother   . 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 Marital Status: Single    Spouse Name: N/A  . Number of Children: N/A  . Years of Education: N/A   Social History Main Topics  . Smoking status: Current Every Day Smoker -- 1.00 packs/day for 10 years    Types: Cigarettes  . Smokeless tobacco: None  . 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 Sexual Activity: Not Currently   Other Topics Concern  . None   Social History Narrative   Hospital Course: 24 year old single female, lives in [facility]( [facility]). States she has been having auditory and visual hallucinations over the last few weeks. States she hears voices telling her to kill herself and that she " cannot trust anyone ". Describes them as " someone yelling through a tunnel ". Also describes visual hallucinations- seeing " shadows that look like Demons". Reports increased anxiety, depression, but stresses that at this time psychotic symptoms are major issue. Patient states she told her counselor about these symptoms so was brought to the hospital. At this time endorses feelingdepressed, endorses suicidal ideations, with thoughts of overdosing. States she has been compliant with medications.  Upon her arrival & admision to the adult unit, Monica Huffman was evaluated & her presenting symptoms identified. The medication regimen for the presenting symptoms were discussed & initiated targeting those symptoms. Those medications are listed below. She was enrolled in the group counseling sessions & encouraged to participate in the unit programming. Her other pre-existing medical problems were identified & her home medications restarted accordingly. Monica Huffman was evaluated on daily basis by the clinical providers to assure her response to the treatment regimen.As her  treatment progressed,  improvement was noted as evidenced by her reports of decreasing symptoms, improved sleep, mood, affect, medication tolerance & active participation in the unit programming. She was encouraged to update her providers on her progress by daily completion of a self inventory assessement, noting mood, mental status, pain, any new symptoms, anxiety & or concerns.  Monica Huffman's  symptoms responded well to her treatment regimen combined with a therapeutic and supportive environment. She was motivated for recovery as evidenced by a positive/appropriate behavior and her interaction with the staff & fellow patients.She also worked closely with the treatment team and case manager to develop a discharge plan with appropriate goals to maintain mood stability after discharge. Coping skills, problem solving as well as relaxation therapies were also part of the unit programming.  Upon her hospital discharge, Monica Huffman was in much improved condition than upon admission.Her symptoms were reported as significantly decreased or resolved completely. She adamantly denies any SIHI,  AVH, delusional thoughts & or paranoia. She was motivated to continue taking medication with a goal of continued improvement in mental health. She will continue psychiatric care on an outpatient basis as noted below. She is provided with all the necessary information required to make theis appointment without problems. Monica Huffman received a 7 days worth, supply [facility]. [facility] in no apparent distress. Transportation per friend.  Physical Findings:  AIMS: Facial and Oral Movements Muscles of Facial Expression: None, normal Lips and Perioral Area: None, normal Jaw: None, normal Tongue: None, normal,Extremity Movements Upper (arms, wrists, hands, fingers): None, normal Lower (legs, knees, ankles, toes): None, normal, Trunk Movements Neck, shoulders, hips: None, normal, Overall Severity Severity of abnormal movements (highest score from questions above): None, normal Incapacitation due to abnormal movements: None, normal Patient's awareness of abnormal movements (rate only patient's report): No Awareness, Dental Status Current problems with teeth and/or dentures?: No Does patient usually wear dentures?: No  CIWA:    COWS:      Musculoskeletal: Strength & Muscle Tone: within normal limits Gait & Station: normal Patient leans: N/A  Psychiatric Specialty Exam: Review of Systems  Constitutional: Negative.   HENT: Negative.   Eyes: Negative.   Respiratory: Negative.   Cardiovascular: Negative.   Gastrointestinal: Negative.   Genitourinary: Negative.   Musculoskeletal: Negative.   Skin: Negative.   Neurological: Negative.   Endo/Heme/Allergies: Negative.   Psychiatric/Behavioral: Positive for depression (Stable). Negative for suicidal ideas, hallucinations, memory loss and substance abuse. The patient has insomnia (Stable). The patient is not nervous/anxious.     Blood pressure 109/58, pulse 82, temperature 97.9 F (36.6 C), temperature source Oral, resp. rate 16, height 5\' 7"  (1.702 m), weight 156.491 kg (345 lb), SpO2 99 %.Body mass index is 54.02 kg/(m^2).  See Md's SRA   Have you used any form of tobacco in the last 30 days? (Cigarettes, Smokeless Tobacco, Cigars, and/or Pipes): Yes  Has this patient used any form of tobacco in the last 30 days? (Cigarettes, Smokeless Tobacco, Cigars, and/or Pipes) Yes, Yes, A prescription for an FDA-approved tobacco cessation medication was offered at discharge and the patient refused  Metabolic Disorder Labs:  Lab Results  Component Value Date   HGBA1C 5.5 10/20/2015   MPG 111 10/20/2015   No results found for: PROLACTIN Lab Results  Component Value Date   CHOL * 09/19/2008    205        ATP III CLASSIFICATION:  <200     mg/dL  Desirable  200-239  mg/dL   Borderline High  >=161    mg/dL   High   TRIG 096* 04/54/0981   HDL 21* 09/19/2008   CHOLHDL 9.8 09/19/2008   VLDL 49* 09/19/2008   LDLCALC * 09/19/2008    135        Total Cholesterol/HDL:CHD Risk Coronary Heart Disease Risk Table                     Men   Women  1/2 Average Risk   3.4   3.3    See Psychiatric Specialty Exam and Suicide Risk Assessment completed by Attending Physician prior to  discharge.  Discharge destination:  Home  Is patient on multiple antipsychotic therapies at discharge:  No   Has Patient had three or more failed trials of antipsychotic monotherapy by history:  No  Recommended Plan for Multiple Antipsychotic Therapies: NA     Discharge Instructions    Diet - low sodium heart healthy    Complete by:  As directed             Medication List    STOP taking these medications        haloperidol 5 MG tablet  Commonly known as:  HALDOL     lithium carbonate 300 MG capsule      TAKE these medications      Indication   ARIPiprazole 5 MG tablet  Commonly known as:  ABILIFY  Take 1 tablet (5 mg total) by mouth daily. For mood control   Indication:  Mood control     gabapentin 400 MG capsule  Commonly known as:  NEURONTIN  Take 1 capsule (400 mg total) by mouth 3 (three) times daily - between meals and at bedtime. For agitation   Indication:  Agitation     hydrOXYzine 25 MG tablet  Commonly known as:  ATARAX/VISTARIL  Take 1 tablet (25 mg total) by mouth 3 (three) times daily as needed for anxiety.   Indication:  Anxiety     levothyroxine 25 MCG tablet  Commonly known as:  SYNTHROID, LEVOTHROID  Take 1 tablet (25 mcg total) by mouth daily before breakfast. For low thyroid function   Indication:  Underactive Thyroid     nicotine 21 mg/24hr patch  Commonly known as:  NICODERM CQ - dosed in mg/24 hours  Place 1 patch (21 mg total) onto the skin daily. For smoking cessation   Indication:  Nicotine Addiction     traZODone 100 MG tablet  Commonly known as:  DESYREL  Take 1 tablet (100 mg total) by mouth at bedtime. For sleep   Indication:  Trouble Sleeping       Follow-up Information    [facility] with [facility].   Why:  Walk-in clinic for assessment for medication management services Monday-Friday between 8:30am - 12pm &  2pm- 3:30pm.    Contact information:   [address] [place]  [address] [phone] Fax [phone]     Follow-up recommendations: Activity:  As tolerated Diet: As recommended by your primary care doctor. Keep all scheduled follow-up appointments as recommended.    Comments: Take all your medications as prescribed by your mental healthcare provider. Report any adverse effects and or reactions from your medicines to your outpatient provider promptly. Patient is instructed and cautioned to not engage in alcohol and or illegal drug use while on prescription medicines. In the event of worsening symptoms, patient  is instructed to call the crisis hotline, 911 and or go to the nearest ED for appropriate evaluation and treatment of symptoms. Follow-up with your primary care provider for your other medical issues, concerns and or health care needs.   Signed: Sanjuana Kava, PMHNP, FNP-BC 12/17/2015, 9:34 AM

## 2015-12-17 NOTE — Progress Notes (Signed)
D. Pt had been up and visible in milieu this evening, did attend and participate in evening group activity. Pt spoke about feeling anxious throughout the day today and requested and received vistaril to help with anxiety. Pt spoke about discharge in the morning and spoke about how her ride can only come around 11am. Pt denied any SI and spoke about feeling ready for discharge. Pt did receive all medications without incident this evening. A. Support and encouragement provided. R. Safety maintained, will continue to monitor.

## 2017-07-24 ENCOUNTER — Encounter (HOSPITAL_COMMUNITY): Payer: Self-pay | Admitting: Emergency Medicine

## 2017-07-24 ENCOUNTER — Emergency Department (HOSPITAL_COMMUNITY): Payer: Self-pay

## 2017-07-24 ENCOUNTER — Emergency Department (HOSPITAL_COMMUNITY)
Admission: EM | Admit: 2017-07-24 | Discharge: 2017-07-24 | Disposition: A | Discharge status: Home / Self Care | Payer: Self-pay | Attending: Emergency Medicine | Admitting: Emergency Medicine

## 2017-07-24 DIAGNOSIS — F17210 Nicotine dependence, cigarettes, uncomplicated: Secondary | ICD-10-CM | POA: Insufficient documentation

## 2017-07-24 DIAGNOSIS — Z8781 Personal history of (healed) traumatic fracture: Secondary | ICD-10-CM | POA: Insufficient documentation

## 2017-07-24 DIAGNOSIS — Z79899 Other long term (current) drug therapy: Secondary | ICD-10-CM | POA: Insufficient documentation

## 2017-07-24 DIAGNOSIS — R2242 Localized swelling, mass and lump, left lower limb: Secondary | ICD-10-CM | POA: Insufficient documentation

## 2017-07-24 DIAGNOSIS — M25572 Pain in left ankle and joints of left foot: Secondary | ICD-10-CM | POA: Insufficient documentation

## 2017-07-24 HISTORY — DX: Other fracture of unspecified lower leg, initial encounter for closed fracture: S82.899A

## 2017-07-24 MED ORDER — IBUPROFEN 800 MG PO TABS: 800.0000 mg | ORAL_TABLET | Freq: Three times a day (TID) | ORAL | 0 refills | Status: DC

## 2017-07-24 MED ORDER — IBUPROFEN 800 MG PO TABS
800.0000 mg | ORAL_TABLET | Freq: Once | ORAL | Status: AC
  Administered 2017-07-24: 800 mg via ORAL
  Filled 2017-07-24: qty 1

## 2017-07-24 NOTE — Discharge Instructions (Signed)
1. Medications: alternate ibuprofen and tylenol for pain control, usual home medications 2. Treatment: rest, ice, elevate and use brace, drink plenty of fluids, gentle stretching 3. Follow Up: Please followup with orthopedics as directed or your PCP in 1 week if no improvement for discussion of your diagnoses and further evaluation after today's visit; if you do not have a primary care doctor use the resource guide provided to find one; Please return to the ER for worsening symptoms or other concerns  

## 2017-07-24 NOTE — ED Triage Notes (Signed)
Pt presents with L ankle pain and stiffness x 4 days; pt denies any new injury, some swelling noted; pt ambulatory to triage

## 2017-07-24 NOTE — ED Provider Notes (Signed)
[facility]EMERGENCY DEPT Provider Note   CSN: [id number] Arrival date & time: 07/24/17  0037     History   Chief Complaint Chief Complaint  Patient presents with  . Ankle Pain    HPI Monica Huffman is a 26 y.o. female with a hx of Ankle fracture several years ago, anxiety, bipolar disorder presents to the Emergency Department complaining of gradual, persistent, progressively worsening left ankle pain onset 4 days ago. Patient reports associated feeling of stiffness in her ankle. She denies fevers or chills, redness of her ankle, nausea or vomiting. She denies known injury. Patient denies seeing an orthopedist after her ankle fracture. She will she's been able to ambulate but it does cause pain. She reports some minor swelling noted to the lateral and medial portion of the ankle. She denies history of gout, IV drug use.  The history is provided by the patient and medical records. No language interpreter was used.    Past Medical History:  Diagnosis Date  . Ankle fracture   . Anxiety   . Bipolar disorder (HCC)   . Depression     Patient Active Problem List   Diagnosis Date Noted  . PTSD (post-traumatic stress disorder) 10/19/2015  . Bipolar I disorder, most recent episode depressed (HCC) 10/19/2015  . GAD (generalized anxiety disorder) 10/18/2015    History reviewed. No pertinent surgical history.  OB History    No data available       Home Medications    Prior to Admission medications   Medication Sig Start Date End Date Taking? Authorizing Provider  ARIPiprazole (ABILIFY) 5 MG tablet Take 1 tablet (5 mg total) by mouth daily. For mood control 12/17/15   Armandina StammerNwoko, Agnes I, NP  gabapentin (NEURONTIN) 400 MG capsule Take 1 capsule (400 mg total) by mouth 3 (three) times daily - between meals and at bedtime. For agitation 12/17/15   Armandina StammerNwoko, Agnes I, NP  hydrOXYzine (ATARAX/VISTARIL) 25 MG tablet Take 1 tablet (25 mg total) by mouth 3 (three) times daily as needed for anxiety.  12/17/15   Armandina StammerNwoko, Agnes I, NP  ibuprofen (ADVIL,MOTRIN) 800 MG tablet Take 1 tablet (800 mg total) by mouth 3 (three) times daily. 07/24/17   Velvie Thomaston, Dahlia ClientHannah, PA-C  levothyroxine (SYNTHROID, LEVOTHROID) 25 MCG tablet Take 1 tablet (25 mcg total) by mouth daily before breakfast. For low thyroid function 12/17/15   Nwoko, Nicole KindredAgnes I, NP  nicotine (NICODERM CQ - DOSED IN MG/24 HOURS) 21 mg/24hr patch Place 1 patch (21 mg total) onto the skin daily. For smoking cessation 12/17/15   Armandina StammerNwoko, Agnes I, NP  traZODone (DESYREL) 100 MG tablet Take 1 tablet (100 mg total) by mouth at bedtime. For sleep 12/17/15   Armandina StammerNwoko, Agnes I, NP    Family History Family History  Problem Relation Age of Onset  . Ovarian cancer Mother   . Alcoholism Mother   . Drug abuse Mother   . Drug abuse Sister     Social History Social History  Substance Use Topics  . Smoking status: Current Every Day Smoker    Packs/day: 1.00    Years: 10.00    Types: Cigarettes  . Smokeless tobacco: Not on file  . 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  Gastrointestinal: Negative for nausea and  vomiting.  Musculoskeletal: Positive for arthralgias and joint swelling. Negative for back pain, neck pain and neck stiffness.  Skin: Negative for wound.  Neurological: Negative for numbness.  Hematological: Does not bruise/bleed easily.  Psychiatric/Behavioral: The patient is not nervous/anxious.   All other systems reviewed and are negative.    Physical Exam Updated Vital Signs BP 111/79   Pulse 97   Temp 98.1 F (36.7 C) (Oral)   Resp 18   Ht 5\' 7"  (1.702 m)   Wt (!) 145.2 kg (320 lb)   SpO2 98%   BMI 50.12 kg/m   Physical Exam  Constitutional: She appears well-developed and well-nourished. No distress.  HENT:    Head: Normocephalic and atraumatic.  Eyes: Conjunctivae are normal.  Neck: Normal range of motion.  Cardiovascular: Normal rate, regular rhythm and intact distal pulses.   Pulses:      Dorsalis pedis pulses are 2+ on the left side.       Posterior tibial pulses are 2+ on the left side.  Capillary refill < 3 sec  Pulmonary/Chest: Effort normal and breath sounds normal.  Musculoskeletal: She exhibits tenderness. She exhibits no edema.       Left hip: Normal.       Left knee: Normal.  Left ankle: Slightly decreased range of motion with action, full extension. Minimal tenderness to palpation along the medial and lateral joint lines. No erythema or significant swelling. No increased warmth.  Neurological: She is alert. Coordination normal.  Sensation intact to normal touch in the left lower extremity Strength 5/5 with dorsiflexion and plantar flexion  Skin: Skin is warm and dry. She is not diaphoretic.  No tenting of the skin  Psychiatric: She has a normal mood and affect.  Nursing note and vitals reviewed.    ED Treatments / Results   Radiology Dg Ankle Complete Left  Result Date: 07/24/2017 CLINICAL DATA:  Left ankle pain and stiffness for 4 days, nontraumatic. EXAM: LEFT ANKLE COMPLETE - 3+ VIEW COMPARISON:  None. FINDINGS: There is no evidence of fracture, dislocation, or joint effusion. There is no evidence of arthropathy or other focal bone abnormality. Soft tissues are unremarkable. IMPRESSION: Negative. Electronically Signed   By: Ellery Plunkaniel R Mitchell M.D.   On: 07/24/2017 01:31    Procedures Procedures (including critical care time)  Medications Ordered in ED Medications  ibuprofen (ADVIL,MOTRIN) tablet 800 mg (not administered)     Initial Impression / Assessment and Plan / ED Course  I have reviewed the triage vital signs and the nursing notes.  Pertinent labs & imaging results that were available during my care of the patient were reviewed by me and considered in my  medical decision making (see chart for details).     Patient X-Ray negative for obvious fracture or dislocation. Pain managed in ED. Pt advised to follow up with Her Medicare and subsequently orthopedics if symptoms persist. Patient given brace while in ED, conservative therapy recommended and discussed. Patient will be dc home & is agreeable with above plan.  Final Clinical Impressions(s) / ED Diagnoses   Final diagnoses:  Acute left ankle pain    New Prescriptions New Prescriptions   IBUPROFEN (ADVIL,MOTRIN) 800 MG TABLET    Take 1 tablet (800 mg total) by mouth 3 (three) times daily.     Awab Abebe, Boyd KerbsHannah, PA-C 07/24/17 [id number]    Campos, Kevin, MD 07/25/17 [phone]

## 2017-08-29 ENCOUNTER — Emergency Department (HOSPITAL_COMMUNITY)
Admission: EM | Admit: 2017-08-29 | Discharge: 2017-08-29 | Disposition: A | Discharge status: Home / Self Care | Payer: Self-pay | Attending: Emergency Medicine | Admitting: Emergency Medicine

## 2017-08-29 ENCOUNTER — Encounter (HOSPITAL_COMMUNITY): Payer: Self-pay | Admitting: Emergency Medicine

## 2017-08-29 DIAGNOSIS — Y929 Unspecified place or not applicable: Secondary | ICD-10-CM | POA: Insufficient documentation

## 2017-08-29 DIAGNOSIS — F17210 Nicotine dependence, cigarettes, uncomplicated: Secondary | ICD-10-CM | POA: Insufficient documentation

## 2017-08-29 DIAGNOSIS — Z79899 Other long term (current) drug therapy: Secondary | ICD-10-CM | POA: Insufficient documentation

## 2017-08-29 DIAGNOSIS — J3489 Other specified disorders of nose and nasal sinuses: Secondary | ICD-10-CM

## 2017-08-29 DIAGNOSIS — Y998 Other external cause status: Secondary | ICD-10-CM | POA: Insufficient documentation

## 2017-08-29 DIAGNOSIS — S0992XA Unspecified injury of nose, initial encounter: Secondary | ICD-10-CM | POA: Insufficient documentation

## 2017-08-29 DIAGNOSIS — W51XXXA Accidental striking against or bumped into by another person, initial encounter: Secondary | ICD-10-CM | POA: Insufficient documentation

## 2017-08-29 DIAGNOSIS — Y939 Activity, unspecified: Secondary | ICD-10-CM | POA: Insufficient documentation

## 2017-08-29 NOTE — Discharge Instructions (Signed)
Please read and follow all provided instructions.  Your diagnoses today include:  1. Nose pain   2. Injury of nose, initial encounter     Tests performed today include: Vital signs. See below for your results today.   Medications prescribed:  Take as prescribed   Home care instructions:  Follow any educational materials contained in this packet.  Follow-up instructions: Please follow-up with your primary care provider for further evaluation of symptoms and treatment   Return instructions:  Please return to the Emergency Department if you do not get better, if you get worse, or new symptoms OR  - Fever (temperature greater than 101.83F)  - Bleeding that does not stop with holding pressure to the area    -Severe pain (please note that you may be more sore the day after your accident)  - Chest Pain  - Difficulty breathing  - Severe nausea or vomiting  - Inability to tolerate food and liquids  - Passing out  - Skin becoming red around your wounds  - Change in mental status (confusion or lethargy)  - New numbness or weakness    Please return if you have any other emergent concerns.  Additional Information:  Your vital signs today were: BP 110/73 (BP Location: Right Arm)    Pulse 93    Temp 97.7 F (36.5 C) (Oral)    Resp 16    Ht 5\' 7"  (1.702 m)    Wt (!) 145.2 kg (320 lb)    LMP 08/30/2015    SpO2 98%    BMI 50.12 kg/m  If your blood pressure (BP) was elevated above 135/85 this visit, please have this repeated by your doctor within one month. ---------------
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
## 2017-08-29 NOTE — ED Notes (Signed)
Patient verbalized understanding of discharge instructions and denies any further needs or questions at this time. VS stable. Patient ambulatory with steady gait.  
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
## 2018-08-12 ENCOUNTER — Emergency Department (HOSPITAL_COMMUNITY)
Admission: EM | Admit: 2018-08-12 | Discharge: 2018-08-12 | Disposition: A | Discharge status: Home / Self Care | Payer: Self-pay | Attending: Emergency Medicine | Admitting: Emergency Medicine

## 2018-08-12 ENCOUNTER — Other Ambulatory Visit: Payer: Self-pay

## 2018-08-12 ENCOUNTER — Encounter (HOSPITAL_COMMUNITY): Payer: Self-pay | Admitting: Emergency Medicine

## 2018-08-12 ENCOUNTER — Emergency Department (HOSPITAL_COMMUNITY): Payer: Self-pay

## 2018-08-12 DIAGNOSIS — F17210 Nicotine dependence, cigarettes, uncomplicated: Secondary | ICD-10-CM | POA: Insufficient documentation

## 2018-08-12 DIAGNOSIS — Y929 Unspecified place or not applicable: Secondary | ICD-10-CM | POA: Insufficient documentation

## 2018-08-12 DIAGNOSIS — F319 Bipolar disorder, unspecified: Secondary | ICD-10-CM | POA: Insufficient documentation

## 2018-08-12 DIAGNOSIS — F419 Anxiety disorder, unspecified: Secondary | ICD-10-CM | POA: Insufficient documentation

## 2018-08-12 DIAGNOSIS — Y9389 Activity, other specified: Secondary | ICD-10-CM | POA: Insufficient documentation

## 2018-08-12 DIAGNOSIS — Z79899 Other long term (current) drug therapy: Secondary | ICD-10-CM | POA: Insufficient documentation

## 2018-08-12 DIAGNOSIS — Y998 Other external cause status: Secondary | ICD-10-CM | POA: Insufficient documentation

## 2018-08-12 DIAGNOSIS — W010XXA Fall on same level from slipping, tripping and stumbling without subsequent striking against object, initial encounter: Secondary | ICD-10-CM | POA: Insufficient documentation

## 2018-08-12 DIAGNOSIS — S93401A Sprain of unspecified ligament of right ankle, initial encounter: Secondary | ICD-10-CM | POA: Insufficient documentation

## 2018-08-12 NOTE — ED Provider Notes (Signed)
[facility]   CSN: [id number] Arrival date & time: 08/12/18  2053     History   Chief Complaint Chief Complaint  Patient presents with  . Ankle Pain    HPI Monica Huffman is a 27 y.o. female.  27 year old female presents after injuring her right ankle yesterday when she had a mechanical fall.  Now notes pain at the right lateral ankle is worse with ambulation and characterizes sharp.  Denies any associated right knee or right hip pain.  Has been using over-the-counter medications without relief.     Past Medical History:  Diagnosis Date  . Ankle fracture   . Anxiety   . Bipolar disorder (HCC)   . Depression     Patient Active Problem List   Diagnosis Date Noted  . PTSD (post-traumatic stress disorder) 10/19/2015  . Bipolar I disorder, most recent episode depressed (HCC) 10/19/2015  . GAD (generalized anxiety disorder) 10/18/2015    History reviewed. No pertinent surgical history.   OB History   None      Home Medications    Prior to Admission medications   Medication Sig Start Date End Date Taking? Authorizing Provider  ARIPiprazole (ABILIFY) 5 MG tablet Take 1 tablet (5 mg total) by mouth daily. For mood control 12/17/15   Armandina StammerNwoko, Agnes I, NP  gabapentin (NEURONTIN) 400 MG capsule Take 1 capsule (400 mg total) by mouth 3 (three) times daily - between meals and at bedtime. For agitation 12/17/15   Armandina StammerNwoko, Agnes I, NP  hydrOXYzine (ATARAX/VISTARIL) 25 MG tablet Take 1 tablet (25 mg total) by mouth 3 (three) times daily as needed for anxiety. 12/17/15   Armandina StammerNwoko, Agnes I, NP  ibuprofen (ADVIL,MOTRIN) 800 MG tablet Take 1 tablet (800 mg total) by mouth 3 (three) times daily. 07/24/17   Muthersbaugh, Dahlia ClientHannah, PA-C  levothyroxine (SYNTHROID, LEVOTHROID) 25 MCG tablet Take 1 tablet (25 mcg total) by mouth daily before breakfast. For low thyroid function 12/17/15   Nwoko, Nicole KindredAgnes I, NP  nicotine (NICODERM CQ - DOSED IN MG/24  HOURS) 21 mg/24hr patch Place 1 patch (21 mg total) onto the skin daily. For smoking cessation 12/17/15   Armandina StammerNwoko, Agnes I, NP  traZODone (DESYREL) 100 MG tablet Take 1 tablet (100 mg total) by mouth at bedtime. For sleep 12/17/15   Armandina StammerNwoko, Agnes I, NP    Family History Family History  Problem Relation Age of Onset  . Ovarian cancer Mother   . Alcoholism Mother   . Drug abuse Mother   . Drug abuse Sister     Social History Social History   Tobacco Use  . Smoking status: Current Every Day Smoker    Packs/day: 1.00    Years: 10.00    Pack years: 10.00    Types: Cigarettes  . Smokeless tobacco: Current User  Substance Use Topics  . 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    Physical Exam Updated Vital Signs BP 134/86 (BP Location: Right Arm)   Pulse 100   Temp 98.5 F (36.9 C) (Oral)   Resp 18   SpO2 98%  Physical Exam  Constitutional: She is oriented to person, place, and time. She appears well-developed and well-nourished.  Non-toxic appearance. No distress.  HENT:  Head: Normocephalic and atraumatic.  Eyes: Pupils are equal, round, and reactive to light. Conjunctivae, EOM and lids are normal.  Neck: Normal range of motion. Neck supple. No tracheal deviation present. No thyroid mass present.  Cardiovascular: Normal rate, regular rhythm and normal heart sounds. Exam reveals no gallop.  No murmur heard. Pulmonary/Chest: Effort normal and breath sounds normal. No stridor. No respiratory distress. She has no decreased breath sounds. She has no wheezes. She has no rhonchi. She has no rales.  Abdominal: Soft. Normal appearance and bowel sounds are normal.  She exhibits no distension. There is no tenderness. There is no rebound and no CVA tenderness.  Musculoskeletal: Normal range of motion. She exhibits no edema or tenderness.       Feet:  Neurological: She is alert and oriented to person, place, and time. She has normal strength. No cranial nerve deficit or sensory deficit. GCS eye subscore is 4. GCS verbal subscore is 5. GCS motor subscore is 6.  Skin: Skin is warm and dry. No abrasion and no rash noted.  Psychiatric: She has a normal mood and affect. Her speech is normal and behavior is normal.  Nursing note and vitals reviewed.    ED Treatments / Results  Labs (all labs ordered are listed, but only abnormal results are displayed) Labs Reviewed - No data to display  EKG None  Radiology Dg Ankle Complete Right  Result Date: 08/12/2018 CLINICAL DATA:  Lateral right ankle pain after stepping into a hole yesterday. Previous history of a right ankle fracture. EXAM: RIGHT ANKLE - COMPLETE 3+ VIEW COMPARISON:  05/11/2018 FINDINGS: Lateral soft tissue swelling over the right ankle. No evidence of acute fracture or dislocation. No focal bone lesion or bone destruction. Old ununited ossicles inferior to the medial malleolus without change. There is focal relative lucency and slight cortical irregularity at the anterolateral aspect of the talar dome which could indicate an osteochondral defect. Similar appearance present previously. IMPRESSION: Lateral soft tissue swelling. No acute bony abnormalities. Possible osteochondral defect in the talar dome. Electronically Signed   By: Burman NievesWilliam  Stevens M.D.   On: 08/12/2018 21:55    Procedures Procedures (including critical care time)  Medications Ordered in ED Medications - No data to display   Initial Impression / Assessment and Plan / ED Course  I have reviewed the triage vital signs and the nursing notes.  Pertinent labs & imaging results that were available during my care of the patient were  reviewed by me and considered in my medical decision making (see chart for details).     Patient placed in [facility] and given orthopedic referral  Final Clinical Impressions(s) / ED Diagnoses   Final diagnoses:  None    ED Discharge Orders    None       Lorre NickAllen, Rhema Boyett, MD 08/12/18 2223

## 2018-08-12 NOTE — ED Triage Notes (Signed)
Pt c/o right ankle pain and swelling after a fall yesterday. Ambulatory without difficulty.

## 2018-09-01 IMAGING — DX DG ANKLE COMPLETE 3+V*R*
3 series · 3 of 3 positions shown · non-contrast
Comparison: 05/11/2018

CLINICAL DATA: Lateral right ankle pain after stepping into a hole
yesterday. Previous history of a right ankle fracture.

EXAM:
RIGHT ANKLE - COMPLETE 3+ VIEW

[x ankle ap right]
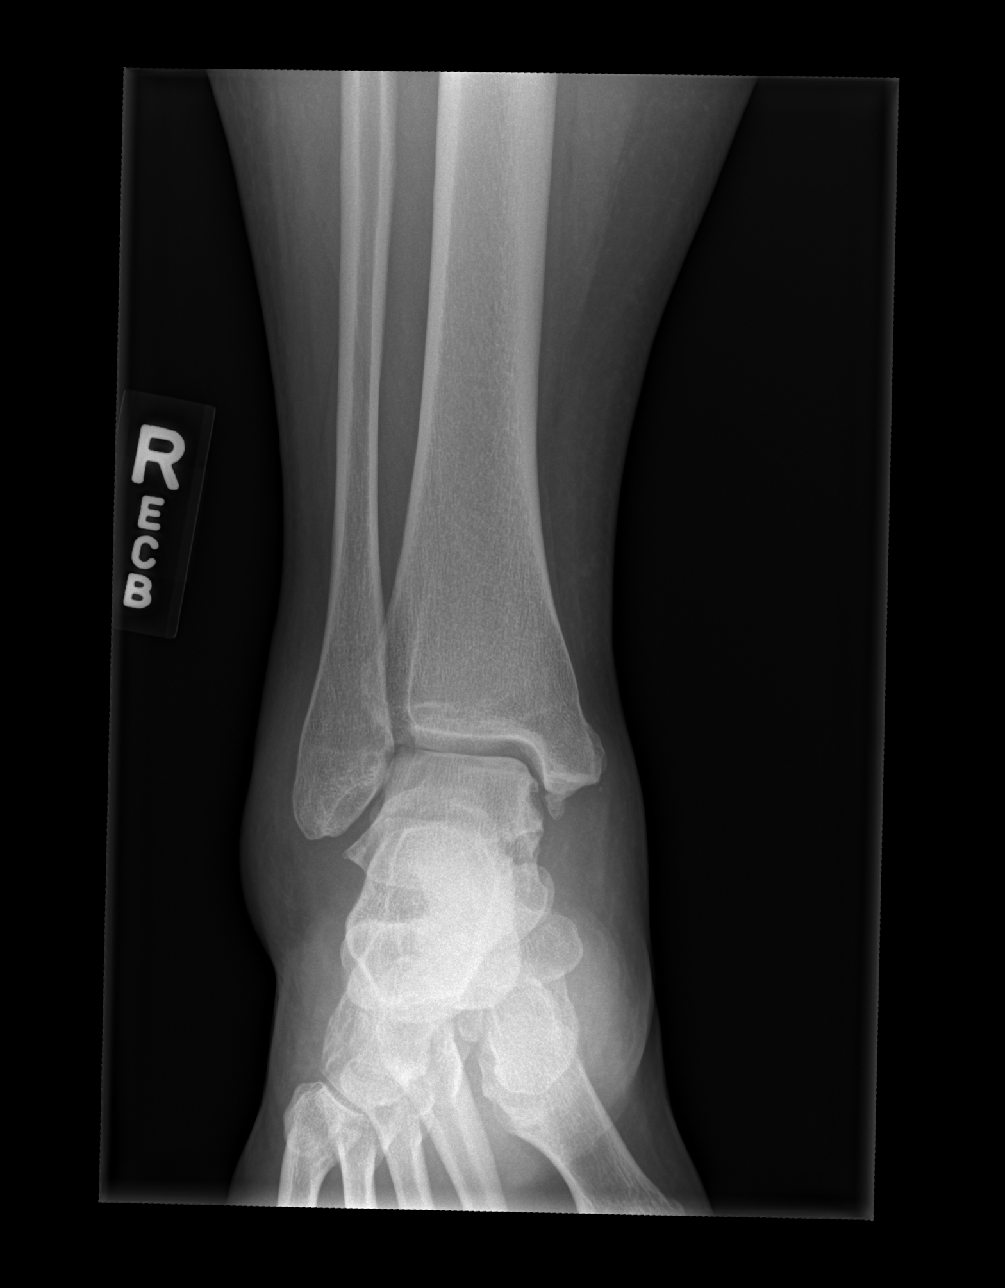

[x ankle obl right]
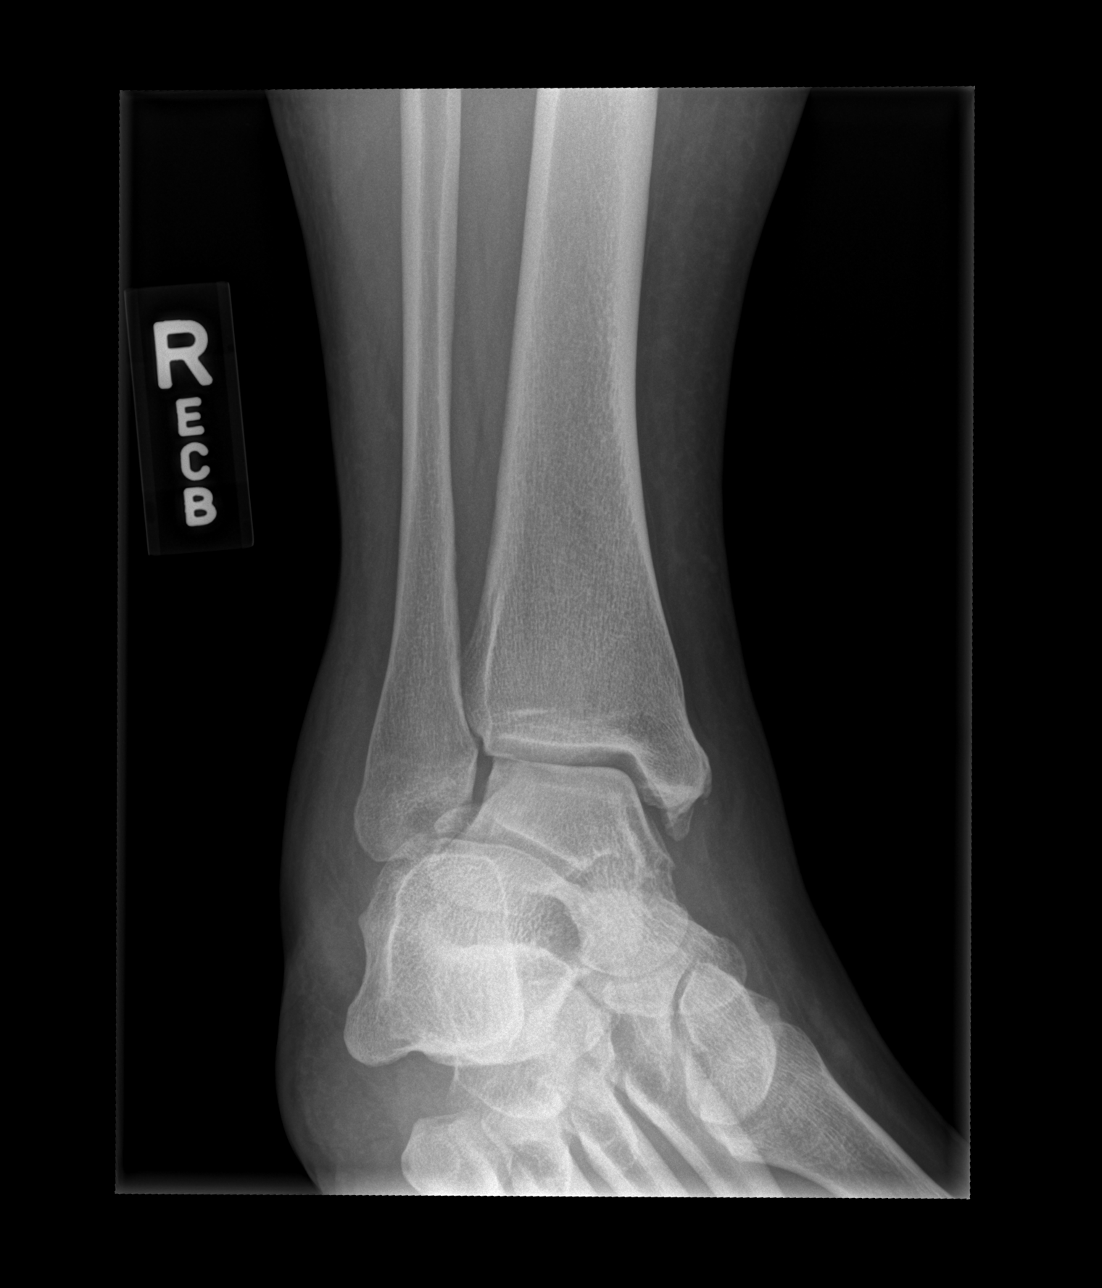

[x ankle lat right]
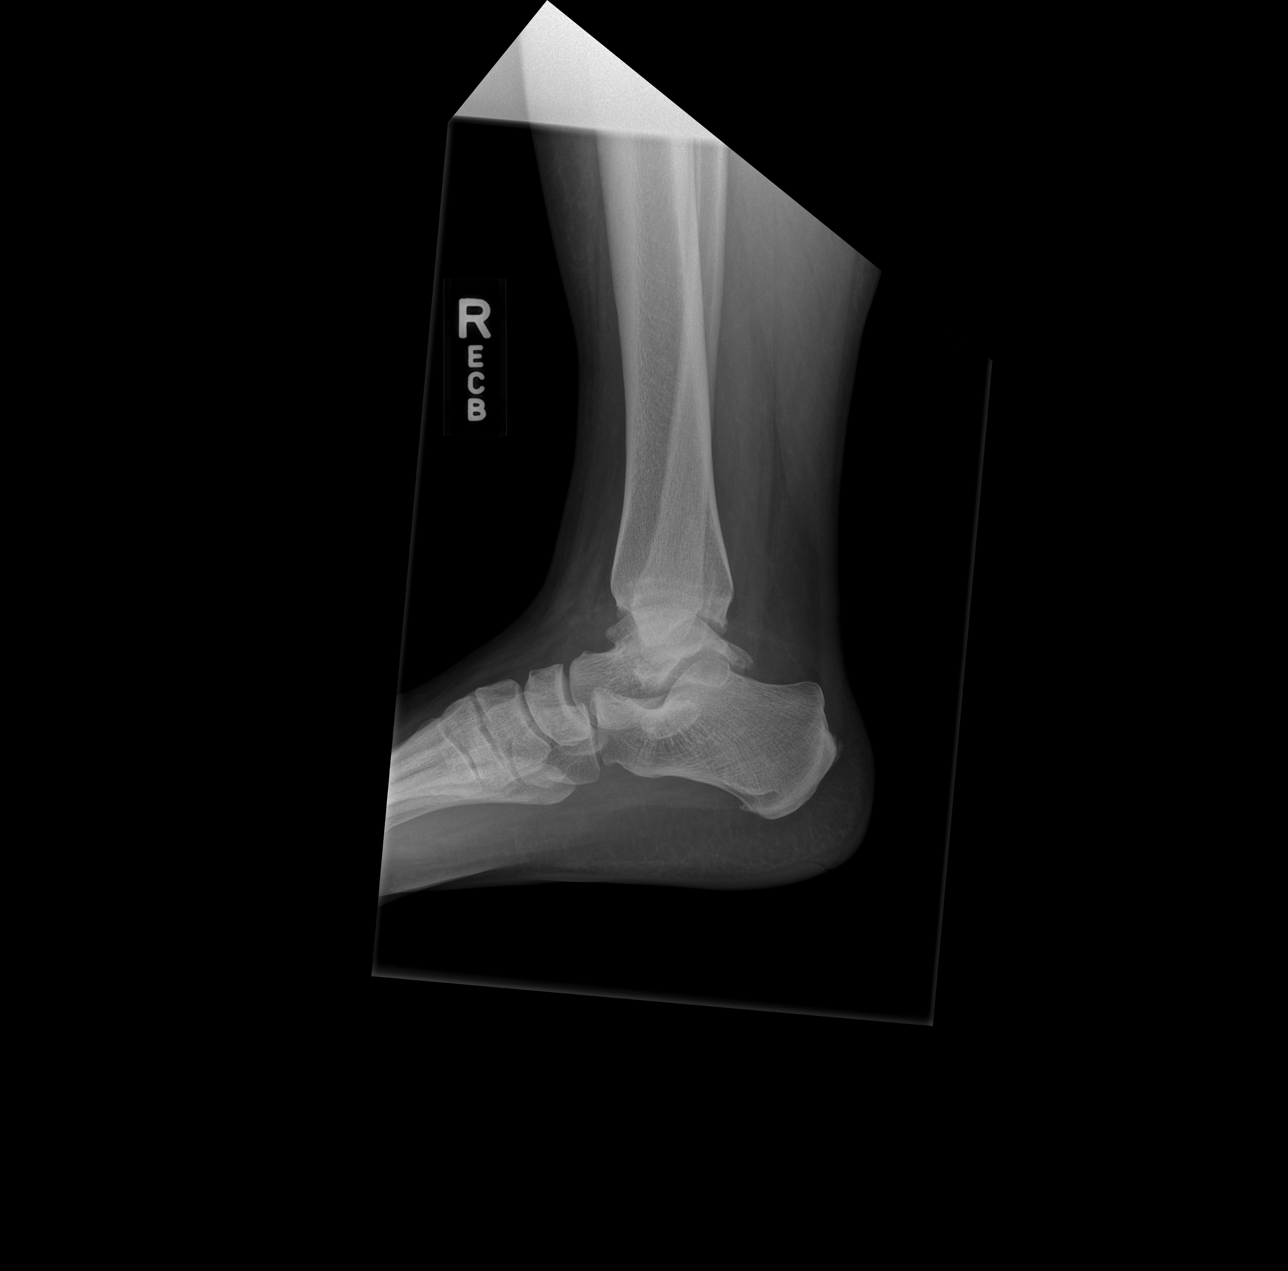

[3 of 3 positions shown; findings below may reference images not displayed]

FINDINGS: Lateral soft tissue swelling over the right ankle. No evidence of
acute fracture or dislocation. No focal bone lesion or bone
destruction. Old ununited ossicles inferior to the medial malleolus
without change. There is focal relative lucency and slight cortical
irregularity at the anterolateral aspect of the talar dome which
could indicate an osteochondral defect. Similar appearance present
previously.
IMPRESSION: Lateral soft tissue swelling. No acute bony abnormalities. Possible
osteochondral defect in the talar dome.

## 2019-02-11 ENCOUNTER — Other Ambulatory Visit: Payer: Self-pay

## 2019-02-11 ENCOUNTER — Encounter (HOSPITAL_COMMUNITY): Payer: Self-pay | Admitting: Emergency Medicine

## 2019-02-11 ENCOUNTER — Emergency Department (HOSPITAL_COMMUNITY)
Admission: EM | Admit: 2019-02-11 | Discharge: 2019-02-12 | Disposition: A | Discharge status: Home / Self Care | Payer: Self-pay | Attending: Emergency Medicine | Admitting: Emergency Medicine

## 2019-02-11 ENCOUNTER — Emergency Department (HOSPITAL_COMMUNITY): Payer: Self-pay

## 2019-02-11 DIAGNOSIS — M25561 Pain in right knee: Secondary | ICD-10-CM | POA: Insufficient documentation

## 2019-02-11 DIAGNOSIS — F17210 Nicotine dependence, cigarettes, uncomplicated: Secondary | ICD-10-CM | POA: Insufficient documentation

## 2019-02-11 DIAGNOSIS — Z79899 Other long term (current) drug therapy: Secondary | ICD-10-CM | POA: Insufficient documentation

## 2019-02-11 NOTE — ED Triage Notes (Signed)
Pt states she has had increasing knee pain on the right for weeks. Has tried heat, ice, elevation, rest with no relief.

## 2019-02-12 NOTE — ED Provider Notes (Signed)
[facility] EMERGENCY DEPARTMENT Provider Note   CSN: [id number] Arrival date & time: 02/11/19  2321     History   Chief Complaint Chief Complaint  Patient presents with  . Knee Pain    HPI Monica Huffman is a 28 y.o. adult.  Patient presents to the emergency department with a chief complaint of right knee pain.  She has had this ongoing for several weeks.  She denies any known injury.  She has tried taking Tylenol and Motrin.  She reports that there is clicking and popping.  Denies any redness or swelling.  Denies any other associated symptoms.  The history is provided by the patient. No language interpreter was used.    Past Medical History:  Diagnosis Date  . Ankle fracture   . Anxiety   . Bipolar disorder (HCC)   . Depression     Patient Active Problem List   Diagnosis Date Noted  . PTSD (post-traumatic stress disorder) 10/19/2015  . Bipolar I disorder, most recent episode depressed (HCC) 10/19/2015  . GAD (generalized anxiety disorder) 10/18/2015    History reviewed. No pertinent surgical history.   OB History   No obstetric history on file.      Home Medications    Prior to Admission medications   Medication Sig Start Date End Date Taking? Authorizing Provider  ARIPiprazole (ABILIFY) 5 MG tablet Take 1 tablet (5 mg total) by mouth daily. For mood control 12/17/15   Armandina Stammer I, NP  gabapentin (NEURONTIN) 400 MG capsule Take 1 capsule (400 mg total) by mouth 3 (three) times daily - between meals and at bedtime. For agitation 12/17/15   Armandina Stammer I, NP  hydrOXYzine (ATARAX/VISTARIL) 25 MG tablet Take 1 tablet (25 mg total) by mouth 3 (three) times daily as needed for anxiety. 12/17/15   Armandina Stammer I, NP  ibuprofen (ADVIL,MOTRIN) 800 MG tablet Take 1 tablet (800 mg total) by mouth 3 (three) times daily. 07/24/17   Muthersbaugh, Dahlia Client, PA-C  levothyroxine (SYNTHROID, LEVOTHROID) 25 MCG tablet Take 1 tablet (25 mcg total) by mouth  daily before breakfast. For low thyroid function 12/17/15   Nwoko, Nicole Kindred I, NP  nicotine (NICODERM CQ - DOSED IN MG/24 HOURS) 21 mg/24hr patch Place 1 patch (21 mg total) onto the skin daily. For smoking cessation 12/17/15   Armandina Stammer I, NP  traZODone (DESYREL) 100 MG tablet Take 1 tablet (100 mg total) by mouth at bedtime. For sleep 12/17/15   Armandina Stammer I, NP    Family History Family History  Problem Relation Age of Onset  . Ovarian cancer Mother   . Alcoholism Mother   . Drug abuse Mother   . Drug abuse Sister     Social History Social History   Tobacco Use  . Smoking status: Current Every Day Smoker    Packs/day: 1.00    Years: 10.00    Pack years: 10.00    Types: Cigarettes  . Smokeless tobacco: Current User  Substance Use Topics  . 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    Physical Exam Updated Vital Signs BP (!) 137/45 (  BP Location: Right Arm)   Pulse 91   Temp 98.1 F (36.7 C) (Oral)   Resp 18   SpO2 98%   Physical Exam Nursing note and vitals reviewed.  Constitutional: Pt appears well-developed and well-nourished. No distress.  HENT:  Head: Normocephalic and atraumatic.  Eyes: Conjunctivae are normal.  Neck: Normal range of motion.  Cardiovascular: Normal rate, regular rhythm. Intact distal pulses.   Capillary refill < 3 sec.  Pulmonary/Chest: Effort normal and breath sounds normal.  Musculoskeletal:  RLE  Pt exhibits no bony abnormality or deformity.   ROM: 5/5  Strength: 5/5  Neurological: Pt  is alert. Coordination normal.  Sensation: 5/5 Skin: Skin is warm and dry. Pt is not diaphoretic.  No evidence of open wound or  skin tenting Psychiatric: Pt has a normal mood and affect.     ED Treatments / Results  Labs (all labs ordered are listed, but only abnormal results are displayed) Labs Reviewed - No data to display  EKG None  Radiology Dg Knee Complete 4 Views Right  Result Date: 02/12/2019 CLINICAL DATA:  Right knee pain for 2 weeks. EXAM: RIGHT KNEE - COMPLETE 4+ VIEW COMPARISON:  None. FINDINGS: No evidence of fracture, dislocation, or joint effusion. No evidence of arthropathy or other focal bone abnormality. Soft tissues are unremarkable. IMPRESSION: Negative. Electronically Signed   By: Sherian Rein M.D.   On: 02/12/2019 00:28    Procedures Procedures (including critical care time)  Medications Ordered in ED Medications - No data to display   Initial Impression / Assessment and Plan / ED Course  I have reviewed the triage vital signs and the nursing notes.  Pertinent labs & imaging results that were available during my care of the patient were reviewed by me and considered in my medical decision making (see chart for details).     Patient presents with right knee pain.  DDx includes, fracture, strain, or sprain.  Consultants: none  Plain films reveal negative.  Patient given ortho referral while in ED, conservative therapy such as RICE recommended and discussed.   Patient will be discharged home & is agreeable with above plan. Returns precautions discussed. Pt appears safe for discharge.   Final Clinical Impressions(s) / ED Diagnoses   Final diagnoses:  Acute pain of right knee    ED Discharge Orders    None       Roxy Horseman, PA-C 02/12/19 2094    Zadie Rhine, MD 02/12/19 [id number]

## 2019-02-12 NOTE — ED Notes (Signed)
2 week Hx of pain.  Pt has tried tylenol, NSAIDS ice etc..

## 2020-07-31 ENCOUNTER — Other Ambulatory Visit: Payer: Self-pay

## 2020-07-31 ENCOUNTER — Encounter (HOSPITAL_COMMUNITY): Payer: Self-pay | Admitting: Psychiatry

## 2020-07-31 ENCOUNTER — Ambulatory Visit (HOSPITAL_COMMUNITY): Payer: No Payment, Other | Admitting: Psychiatry

## 2020-07-31 DIAGNOSIS — F411 Generalized anxiety disorder: Secondary | ICD-10-CM

## 2020-07-31 DIAGNOSIS — F250 Schizoaffective disorder, bipolar type: Secondary | ICD-10-CM

## 2020-07-31 MED ORDER — HYDROXYZINE HCL 25 MG PO TABS: 25.0000 mg | ORAL_TABLET | Freq: Three times a day (TID) | ORAL | 2 refills | Status: DC | PRN

## 2020-07-31 MED ORDER — BUSPIRONE HCL 10 MG PO TABS: 10.0000 mg | ORAL_TABLET | Freq: Three times a day (TID) | ORAL | 2 refills | Status: DC

## 2020-07-31 MED ORDER — QUETIAPINE FUMARATE 25 MG PO TABS: 25.0000 mg | ORAL_TABLET | Freq: Two times a day (BID) | ORAL | 2 refills | Status: DC

## 2020-07-31 MED ORDER — TRAZODONE HCL 50 MG PO TABS: 50.0000 mg | ORAL_TABLET | Freq: Every evening | ORAL | 2 refills | Status: DC | PRN
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
## 2020-08-01 ENCOUNTER — Telehealth (HOSPITAL_COMMUNITY): Payer: Self-pay | Admitting: *Deleted

## 2020-08-01 ENCOUNTER — Other Ambulatory Visit (HOSPITAL_COMMUNITY): Payer: Self-pay | Admitting: *Deleted

## 2020-08-01 DIAGNOSIS — F411 Generalized anxiety disorder: Secondary | ICD-10-CM

## 2020-08-01 MED ORDER — BUSPIRONE HCL 10 MG PO TABS: 10.0000 mg | ORAL_TABLET | Freq: Three times a day (TID) | ORAL | 2 refills | Status: DC

## 2020-08-01 NOTE — Telephone Encounter (Signed)
Ms Doyne Keel NP changed quantity of Buspar from #30 TO #90 to match the sig, New order called into patients pharmacy.

## 2020-08-01 NOTE — Telephone Encounter (Signed)
The quantity should be 90.

## 2020-08-01 NOTE — Telephone Encounter (Signed)
VM left for writer stating she was seen yesterday by Toy Cookey NP and was given a RX for Buspar to be taken TID, but the quantity was for #30. She is asking if the amount can be changed to #90. Will refer this question to Ms Doyne Keel NP.

## 2020-10-01 ENCOUNTER — Ambulatory Visit (INDEPENDENT_AMBULATORY_CARE_PROVIDER_SITE_OTHER): Payer: No Payment, Other | Admitting: Psychiatry

## 2020-10-01 ENCOUNTER — Other Ambulatory Visit: Payer: Self-pay

## 2020-10-01 ENCOUNTER — Encounter (HOSPITAL_COMMUNITY): Payer: Self-pay | Admitting: Psychiatry

## 2020-10-01 DIAGNOSIS — F411 Generalized anxiety disorder: Secondary | ICD-10-CM | POA: Diagnosis not present

## 2020-10-01 DIAGNOSIS — F250 Schizoaffective disorder, bipolar type: Secondary | ICD-10-CM | POA: Diagnosis not present

## 2020-10-01 MED ORDER — QUETIAPINE FUMARATE 50 MG PO TABS: 50.0000 mg | ORAL_TABLET | Freq: Every day | ORAL | 2 refills | Status: DC

## 2020-10-01 MED ORDER — BUSPIRONE HCL 15 MG PO TABS: 15.0000 mg | ORAL_TABLET | Freq: Three times a day (TID) | ORAL | 2 refills | Status: DC

## 2020-10-01 MED ORDER — TRAZODONE HCL 100 MG PO TABS: 100.0000 mg | ORAL_TABLET | Freq: Every evening | ORAL | 2 refills | Status: DC | PRN

## 2020-10-01 MED ORDER — HYDROXYZINE HCL 25 MG PO TABS: 25.0000 mg | ORAL_TABLET | Freq: Three times a day (TID) | ORAL | 2 refills | Status: DC | PRN

## 2020-10-01 NOTE — Progress Notes (Signed)
BH MD/PA/NP OP Progress Note  10/01/2020 9:40 AM Monica Huffman  MRN:  [id number]  Chief Complaint: " I have been having panic attacks twice weekly and is to have problems sleeping"  Chief Complaint    Medication Management     HPI: 29 year old female seen today for follow up psychiatric evaluation.  She has a psychiatric history of bipolar disorder, schizoaffective disorder, anxiety, PTSD, alcohol use disorder, opioid use disorder, and cocaine use disorder.    Currently she is managed on BuSpar 10 mg 3 times a day, hydroxyzine 25 mg 3 times a day as needed, Seroquel 25 mg twice daily, and trazodone 25 mg to 50 mg as needed for sleep.  She notes her medications are somewhat effective in managing her psychiatric conditions however notes that she has panic attacks twice weekly and poor sleep.  Today she is well-groomed, pleasant, cooperative, engaged in conversation, and maintains eye contact.  She notes that her anxiety and depression has improved since restarting medications.  She quantifies her anxiety as 5 out of 10 and her depression is 4 out of 10 (10 being severe anxiety and depression).  She informed provider that she has panic attacks twice weekly and notes that she used grounding techniques, mindfulness, going out side, spending time with her girlfriend, and using her 5 senses to cope with her anxiety.  Patient unaware of triggers of panic attacks and reports that no new life events has occurred.  She notes that she sees and hears things that are not there only when she is having anxiety attacks however notes the Seroquel has been effective in managing her hallucinations.  She denies SI/HI or paranoia.  Patient is agreeable to increasing BuSpar 10 mg 3 times daily to 15 mg 3 times daily to help manage symptoms of anxiety.  She is also agreeable to increasing trazodone 25 mg to 50 mg nightly to 100 mg to help manage sleep.  She will continue all other medications as prescribed.  Patient informed  provider that she received counseling at family services however notes that she has been told that her counselor will switch.  She notes that she would like to receive counseling from [facility] behavioral health.  Writer referred patient to counselor.  No other concerns noted at this time . Visit Diagnosis:    ICD-10-CM   1. GAD (generalized anxiety disorder)  F41.1 busPIRone (BUSPAR) 15 MG tablet    hydrOXYzine (ATARAX/VISTARIL) 25 MG tablet    Ambulatory referral to Social Work  2. Schizoaffective disorder, bipolar type (HCC)  F25.0 QUEtiapine (SEROQUEL) 50 MG tablet    traZODone (DESYREL) 100 MG tablet    Ambulatory referral to Social Work    Past Psychiatric History: Schizoaffective, PTSD, alcohol use disorder, opioid use disorder, and cocaine use disorder Past Medical History:  Past Medical History:  Diagnosis Date  . Ankle fracture   . Anxiety   . Bipolar disorder (HCC)   . Depression    No past surgical history on file.  Family Psychiatric History: Mother depression, anxiety, and poly substance use (cocaine and alcohol), Father former heroin abuse (25 years sober), sister (herion use), sister (heroin use), third sister (sober from alcohol for 2 years), brother schizophrenia and (recovering from meth abuse), maternal/paternal grandfather substance use  Family History:  Family History  Problem Relation Age of Onset  . Ovarian cancer Mother   . Alcoholism Mother   . Drug abuse Mother   . Drug abuse Sister     Social  History:  Social History   Socioeconomic History  . Marital status: Single    Spouse name: Not on file  . Number of children: Not on file  . Years of education: Not on file  . Highest education level: Not on file  Occupational History  . Not on file  Tobacco Use  . Smoking status: Current Every Day Smoker    Packs/day: 1.00    Years: 10.00    Pack years: 10.00    Types: Cigarettes  . Smokeless tobacco: Current User  Substance and Sexual  Activity  . 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 Drug use: No    Comment: Pt reports that she has been clean from pills and cocaine since Septemeber 27, 2014  . Sexual activity: Not Currently  Other Topics Concern  . Not on file  Social History Narrative  . Not on file   Social Determinants of Health   Financial Resource Strain:   . Difficulty of Paying Living Expenses: Not on file  Food Insecurity:   . Worried About Programme researcher, broadcasting/film/video in the Last Year: Not on file  . Ran Out of Food in the Last Year: Not on file  Transportation Needs:   . Lack of Transportation (Medical): Not on file  . Lack of Transportation (Non-Medical): Not on file  Physical Activity:   . Days of Exercise per Week: Not on file  . Minutes of Exercise per Session: Not on file  Stress:   . Feeling of Stress : Not on file  Social Connections:   . Frequency of Communication with Friends and Family: Not on file  . Frequency of Social Gatherings with Friends and Family: Not on file  . Attends Religious Services: Not on file  . Active Member of Clubs or Organizations: Not on file  . Attends Banker Meetings: Not on file  . Marital Status: Not on file    Allergies:  Allergies  Allergen Reactions  . Depakote Er [Divalproex Sodium Er] Swelling    Metabolic Disorder Labs: Lab Results  Component Value Date   HGBA1C 5.5 10/20/2015   MPG 111 10/20/2015   No results found for: PROLACTIN Lab Results  Component Value Date   CHOL (H) 09/19/2008    205        ATP III CLASSIFICATION:  <200     mg/dL   Desirable  557-322  mg/dL   Borderline High  >=025    mg/dL   High   TRIG 427 (H) 06/21/7627   HDL 21 (L) 09/19/2008   CHOLHDL 9.8 09/19/2008   VLDL 49 (H) 09/19/2008   LDLCALC (H) 09/19/2008    135        Total Cholesterol/HDL:CHD Risk Coronary Heart Disease Risk Table                     Men   Women  1/2 Average  Risk   3.4   3.3     Therapeutic Level Labs: Lab Results  Component Value Date   LITHIUM 0.07 (L) 12/14/2015   Lab Results  Component Value Date   VALPROATE <1.0 Result repeated and verified. (L) 09/16/2008   VALPROATE 35.5 (L) 01/30/2008   No components found for:  CBMZ  Current Medications: Current Outpatient Medications  Medication Sig Dispense Refill  . busPIRone (BUSPAR) 15 MG tablet Take 1 tablet (15 mg total) by mouth  3 (three) times daily. 90 tablet 2  . hydrOXYzine (ATARAX/VISTARIL) 25 MG tablet Take 1 tablet (25 mg total) by mouth 3 (three) times daily as needed for anxiety. 90 tablet 2  . ibuprofen (ADVIL,MOTRIN) 800 MG tablet Take 1 tablet (800 mg total) by mouth 3 (three) times daily. 21 tablet 0  . QUEtiapine (SEROQUEL) 50 MG tablet Take 1 tablet (50 mg total) by mouth at bedtime. 30 tablet 2  . traZODone (DESYREL) 100 MG tablet Take 1 tablet (100 mg total) by mouth at bedtime as needed for sleep. For sleep 30 tablet 2   No current facility-administered medications for this visit.     Musculoskeletal: Strength & Muscle Tone: within normal limits Gait & Station: normal Patient leans: N/A  Psychiatric Specialty Exam: Review of Systems  Blood pressure 131/85, pulse (!) 101, temperature 98.7 F (37.1 C), temperature source Oral, height 5\' 8"  (1.727 m), SpO2 99 %.Body mass index is 48.66 kg/m.  General Appearance: Well Groomed  Eye Contact:  Good  Speech:  Clear and Coherent and Normal Rate  Volume:  Normal  Mood:  Anxious and Depressed  Affect:  Appropriate and Congruent  Thought Process:  Coherent, Goal Directed and Linear  Orientation:  NA  Thought Content: Logical, Hallucinations: Auditory Visual and Occurs when she is having a panic attack   Suicidal Thoughts:  No  Homicidal Thoughts:  No  Memory:  Immediate;   Good Recent;   Good Remote;   Good  Judgement:  Good  Insight:  Good  Psychomotor Activity:  Normal  Concentration:  Concentration: Good  and Attention Span: Good  Recall:  Good  Fund of Knowledge: Good  Language: Good  Akathisia:  No  Handed:  Right  AIMS (if indicated): Not done  Assets:  Communication Skills Desire for Improvement Financial Resources/Insurance Housing Social Support  ADL's:  Intact  Cognition: WNL  Sleep:  Poor   Screenings: AIMS     Ad[facility] (Discharged) [facility] Admission (Discharged) from [facility] Admission (Discharged) from OP Visit from 05/09/2015 [facility]  AIMS Total Score 0 0 0    AUDIT     Admission (Discharged) from [facility] Admission (Discharged) from OP Visit [facility] Admission (Discharged) from OP Visit from 05/09/2015 in [facility]  Alcohol Use Disorder Identification Test Final Score (AUDIT) 0 2 4       Assessment and Plan: Patient notes that her anxiety and depression has improved since restarting medications.  She however notes that she has panic attacks twice weekly.  She is agreeable to increasing BuSpar 10 mg 3 times daily to 15 mg 3 times daily for anxiety.  She also notes that she has difficulty sleeping and is agreeable to increase trazodone 50 mg to 100 mg to help manage sleep.  She will continue all other medications as prescribed.    1. GAD (generalized anxiety disorder)  Increased- busPIRone (BUSPAR) 15 MG tablet; Take 1 tablet (15 mg total) by mouth 3 (three) times daily.  Dispense: 90 tablet; Refill: 2 Continue- hydrOXYzine (ATARAX/VISTARIL) 25 MG tablet; Take 1 tablet (25 mg total) by mouth 3 (three) times daily as needed for anxiety.  Dispense: 90 tablet; Refill: 2 - Ambulatory referral to Social Work  2. Schizoaffective disorder, bipolar type (  HCC)  Continue-  QUEtiapine (SEROQUEL) 50 MG tablet; Take 1 tablet (50 mg total) by mouth at bedtime.  Dispense: 30 tablet; Refill: 2 Increased- traZODone (DESYREL) 100 MG tablet; Take 1 tablet (100 mg total) by mouth at bedtime as needed for sleep. For sleep  Dispense: 30 tablet; Refill: 2 - Ambulatory referral to Social Work  Follow-up in 3 months Follow-up with therapy    Shanna CiscoBrittney E Janayah Zavada, NP 10/01/2020, 9:40 AM

## 2020-10-23 ENCOUNTER — Ambulatory Visit (INDEPENDENT_AMBULATORY_CARE_PROVIDER_SITE_OTHER): Payer: No Payment, Other | Admitting: Clinical

## 2020-10-23 ENCOUNTER — Other Ambulatory Visit: Payer: Self-pay

## 2020-10-23 DIAGNOSIS — F250 Schizoaffective disorder, bipolar type: Secondary | ICD-10-CM | POA: Diagnosis not present

## 2020-10-23 DIAGNOSIS — F4310 Post-traumatic stress disorder, unspecified: Secondary | ICD-10-CM

## 2020-10-23 DIAGNOSIS — F1021 Alcohol dependence, in remission: Secondary | ICD-10-CM
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
## 2020-12-07 ENCOUNTER — Ambulatory Visit (HOSPITAL_COMMUNITY): Payer: No Payment, Other | Admitting: Clinical

## 2020-12-17 ENCOUNTER — Ambulatory Visit (HOSPITAL_COMMUNITY): Payer: Self-pay | Admitting: Clinical

## 2021-01-01 ENCOUNTER — Encounter (HOSPITAL_COMMUNITY): Payer: Self-pay | Admitting: Psychiatry

## 2021-01-01 ENCOUNTER — Other Ambulatory Visit: Payer: Self-pay

## 2021-01-01 ENCOUNTER — Telehealth (INDEPENDENT_AMBULATORY_CARE_PROVIDER_SITE_OTHER): Payer: No Payment, Other | Admitting: Psychiatry

## 2021-01-01 DIAGNOSIS — F250 Schizoaffective disorder, bipolar type: Secondary | ICD-10-CM | POA: Diagnosis not present

## 2021-01-01 DIAGNOSIS — F411 Generalized anxiety disorder: Secondary | ICD-10-CM | POA: Diagnosis not present

## 2021-01-01 MED ORDER — QUETIAPINE FUMARATE 100 MG PO TABS: 100.0000 mg | ORAL_TABLET | Freq: Every day | ORAL | 2 refills | Status: DC

## 2021-01-01 MED ORDER — HYDROXYZINE HCL 25 MG PO TABS: 25.0000 mg | ORAL_TABLET | Freq: Three times a day (TID) | ORAL | 2 refills | Status: DC | PRN

## 2021-01-01 MED ORDER — TRAZODONE HCL 100 MG PO TABS: 100.0000 mg | ORAL_TABLET | Freq: Every evening | ORAL | 2 refills | Status: DC | PRN

## 2021-01-01 MED ORDER — BUSPIRONE HCL 15 MG PO TABS: 15.0000 mg | ORAL_TABLET | Freq: Three times a day (TID) | ORAL | 2 refills | Status: DC

## 2021-01-01 NOTE — Progress Notes (Signed)
BH MD/PA/NP OP Progress Note Virtual Visit via Telephone Note  I connected with Monica Huffman on 01/01/21 at  8:30 AM EST by telephone and verified that I am speaking with the correct person using two identifiers.  Location: Patient: home Provider: Clinic   I discussed the limitations, risks, security and privacy concerns of performing an evaluation and management service by telephone and the availability of in person appointments. I also discussed with the patient that there may be a patient responsible charge related to this service. The patient expressed understanding and agreed to proceed.   I provided 30 minutes of non-face-to-face time during this encounter.   01/01/2021 9:37 AM Monica Huffman  MRN:  [id number]  Chief Complaint: " I feel like thing are great"   HPI: 30 year old female seen today for follow up psychiatric evaluation.  She has a psychiatric history of bipolar disorder, schizoaffective disorder, anxiety, PTSD, alcohol use disorder, opioid use disorder, and cocaine use disorder.    Currently she is managed on BuSpar 15 mg 3 times a day, hydroxyzine 25 mg 3 times a day as needed, Seroquel 50 nightly, and trazodone 100 mg as needed for sleep.  She notes her medications are somewhat effective in managing her psychiatric conditions.   Today patient unable to log in virtually so her exam was done over the phone.  She notes that since her last visit things have been great.  She notes that she has very minimal anxiety and depression.  Provider conducted a GAD-7 and patient scored an 8.  Provider also conducted a PHQ-9 and patient scored a 5.  She notes that things have been improving in her life.  She notes that she and her wife has found a donor to help them conceive.  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  Patient informed provider that last week she was manic.  She notes that she and her wife are able to identify when an  episode is about to occur.  Because of this she notes that she transferred all of her money into her wife's account.  She informed provider during that time she had poor sleep, increased energy, racing thoughts, and fluctuations in her mood. She denies SI/HI or paranoia.  She notes that once every 3 weeks she has a hallucinations however notes that she has not had one in a while.  She denies illegal substance use and notes that she has been sober for 7 years.  Patient informed provider that at times she has poor sleep.  She notes that at times she sleeps 3 to 4 hours, wakes every hour, and at other times sleeps 8 to 9 hours.  Patient agreeable to increase Seroquel 50 mg at 100 mg to help manage sleep, mood, and symptoms of psychosis.  She will continue all other medications as prescribed.  She will follow-up with outpatient counseling for therapy.  No other concerns noted at this time.   . Visit Diagnosis:    ICD-10-CM   1. GAD (generalized anxiety disorder)  F41.1 busPIRone (BUSPAR) 15 MG tablet    hydrOXYzine (ATARAX/VISTARIL) 25 MG tablet  2. Schizoaffective disorder, bipolar type (HCC)  F25.0 QUEtiapine (SEROQUEL) 100 MG tablet    traZODone (DESYREL) 100 MG tablet    Past Psychiatric History: Schizoaffective, PTSD, alcohol use disorder, opioid use disorder, and cocaine use disorder Past Medical History:  Past Medical History:  Diagnosis Date  . Ankle fracture   .  Anxiety   . Bipolar disorder (HCC)   . Depression    No past surgical history on file.  Family Psychiatric History: Mother depression, anxiety, and poly substance use (cocaine and alcohol), Father former heroin abuse (25 years sober), sister (herion use), sister (heroin use), third sister (sober from alcohol for 2 years), brother schizophrenia and (recovering from meth abuse), maternal/paternal grandfather substance use  Family History:  Family History  Problem Relation Age of Onset  . Ovarian cancer Mother   . Alcoholism  Mother   . Drug abuse Mother   . Drug abuse Sister     Social History:  Social History   Socioeconomic History  . Marital status: Single    Spouse name: Not on file  . Number of children: Not on file  . Years of education: Not on file  . Highest education level: Not on file  Occupational History  . Not on file  Tobacco Use  . Smoking status: Current Every Day Smoker    Packs/day: 1.00    Years: 10.00    Pack years: 10.00    Types: Cigarettes  . Smokeless tobacco: Current User  Substance and Sexual Activity  . 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 Drug use: No    Comment: Pt reports that she has been clean from pills and cocaine since Septemeber 27, 2014  . Sexual activity: Not Currently  Other Topics Concern  . Not on file  Social History Narrative  . Not on file   Social Determinants of Health   Financial Resource Strain: Not on file  Food Insecurity: Not on file  Transportation Needs: Not on file  Physical Activity: Not on file  Stress: Not on file  Social Connections: Not on file    Allergies:  Allergies  Allergen Reactions  . Depakote Er [Divalproex Sodium Er] Swelling    Metabolic Disorder Labs: Lab Results  Component Value Date   HGBA1C 5.5 10/20/2015   MPG 111 10/20/2015   No results found for: PROLACTIN Lab Results  Component Value Date   CHOL (H) 09/19/2008    205        ATP III CLASSIFICATION:  <200     mg/dL   Desirable  539-767  mg/dL   Borderline High  >=341    mg/dL   High   TRIG 937 (H) 90/24/0973   HDL 21 (L) 09/19/2008   CHOLHDL 9.8 09/19/2008   VLDL 49 (H) 09/19/2008   LDLCALC (H) 09/19/2008    135        Total Cholesterol/HDL:CHD Risk Coronary Heart Disease Risk Table                     Men   Women  1/2 Average Risk   3.4   3.3     Therapeutic Level Labs: Lab Results  Component Value Date   LITHIUM 0.07 (L) 12/14/2015   Lab Results   Component Value Date   VALPROATE <1.0 Result repeated and verified. (L) 09/16/2008   VALPROATE 35.5 (L) 01/30/2008   No components found for:  CBMZ  Current Medications: Current Outpatient Medications  Medication Sig Dispense Refill  . busPIRone (BUSPAR) 15 MG tablet Take 1 tablet (15 mg total) by mouth 3 (three) times daily. 90 tablet 2  . hydrOXYzine (ATARAX/VISTARIL) 25 MG tablet Take 1 tablet (25 mg total) by mouth 3 (three) times daily as needed for  anxiety. 90 tablet 2  . ibuprofen (ADVIL,MOTRIN) 800 MG tablet Take 1 tablet (800 mg total) by mouth 3 (three) times daily. 21 tablet 0  . QUEtiapine (SEROQUEL) 100 MG tablet Take 1 tablet (100 mg total) by mouth at bedtime. 30 tablet 2  . traZODone (DESYREL) 100 MG tablet Take 1 tablet (100 mg total) by mouth at bedtime as needed for sleep. For sleep 30 tablet 2   No current facility-administered medications for this visit.     Musculoskeletal: Strength & Muscle Tone: Unable to assess due to telephone visit Gait & Station: Unable to assess due to telephone visit Patient leans: N/A  Psychiatric Specialty Exam: Review of Systems  There were no vitals taken for this visit.There is no height or weight on file to calculate BMI.  General Appearance: Unable to assess due to telephone visit  Eye Contact:  Unable to assess due to telephone visit  Speech:  Clear and Coherent and Normal Rate  Volume:  Normal  Mood:  Euthymic  Affect:  Appropriate and Congruent  Thought Process:  Coherent, Goal Directed and Linear  Orientation:  NA  Thought Content: Logical and Hallucinations: Auditory Visual has not occured in 3 weeks   Suicidal Thoughts:  No  Homicidal Thoughts:  No  Memory:  Immediate;   Good Recent;   Good Remote;   Good  Judgement:  Good  Insight:  Good  Psychomotor Activity:  Normal  Concentration:  Concentration: Good and Attention Span: Good  Recall:  Good  Fund of Knowledge: Good  Language: Good  Akathisia:  No   Handed:  Right  AIMS (if indicated): Not done  Assets:  Communication Skills Desire for Improvement Financial Resources/Insurance Housing Social Support  ADL's:  Intact  Cognition: WNL  Sleep:  Fair   Screenings: AIMS   Flowsheet Row Ad[facility] (Discharged) [facility] Admission (Discharged) from [facility] Admission (Discharged) from [facility]  AIMS Total Score 0 0 0    AUDIT   Flowsheet Row Admission (Discharged) [facility] Admission (Discharged) from [facility] Admission (Discharged) from OP Visit from 05[facility]09/2015 [facility]  Alcohol Use Disorder Identification Test Final Score (AUDIT) 0 2 4    GAD-7   Flowsheet Row Video Visit from 01/01/2021 in [facility] Counselor from 10/23/2020 in [facility]  Total GAD-7 Score 8 14    PHQ2-9   Flowsheet Row Video Visit from 01/01/2021 in [facility] Counselor from 10/23/2020 in [facility]  PHQ-2 Total Score 0 5  PHQ-9 Total Score 5 14       Assessment and Plan: Patient notes that her anxiety and depression has improved since her last visit.  She however notes that she has difficulty staying asleep, hypomanic behaviors, and occasional VAH.  She is agreeable to increase Seroquel 50 mg to 100 mg to help manage the symptoms.  She will continue all other medications as prescribed.   1. GAD (generalized anxiety disorder)  Continue- busPIRone (BUSPAR) 15 MG tablet; Take 1 tablet (15 mg total) by mouth 3 (three) times daily.  Dispense: 90 tablet; Refill: 2 Continue- hydrOXYzine (ATARAX/VISTARIL) 25  MG tablet; Take 1 tablet (25 mg  total) by mouth 3 (three) times daily as needed for anxiety.  Dispense: 90 tablet; Refill: 2  2. Schizoaffective disorder, bipolar type (HCC)  Increased- QUEtiapine (SEROQUEL) 100 MG tablet; Take 1 tablet (100 mg total) by mouth at bedtime.  Dispense: 30 tablet; Refill: 2 Continue- traZODone (DESYREL) 100 MG tablet; Take 1 tablet (100 mg total) by mouth at bedtime as needed for sleep. For sleep  Dispense: 30 tablet; Refill: 2  Follow-up in 3 months Follow-up with therapy    Salley Slaughter, NP 01/01/2021, 9:37 AM

## 2021-04-01 ENCOUNTER — Telehealth (HOSPITAL_COMMUNITY): Payer: No Payment, Other | Admitting: Psychiatry

## 2021-04-11 ENCOUNTER — Other Ambulatory Visit (HOSPITAL_COMMUNITY): Payer: Self-pay | Admitting: Psychiatry

## 2021-04-11 DIAGNOSIS — F250 Schizoaffective disorder, bipolar type: Secondary | ICD-10-CM

## 2021-04-17 ENCOUNTER — Encounter (HOSPITAL_COMMUNITY): Payer: Self-pay | Admitting: Psychiatry

## 2021-04-17 ENCOUNTER — Telehealth (INDEPENDENT_AMBULATORY_CARE_PROVIDER_SITE_OTHER): Payer: No Payment, Other | Admitting: Psychiatry

## 2021-04-17 ENCOUNTER — Other Ambulatory Visit: Payer: Self-pay

## 2021-04-17 DIAGNOSIS — F250 Schizoaffective disorder, bipolar type: Secondary | ICD-10-CM | POA: Diagnosis not present

## 2021-04-17 DIAGNOSIS — F411 Generalized anxiety disorder: Secondary | ICD-10-CM

## 2021-04-17 MED ORDER — FLUOXETINE HCL 10 MG PO CAPS
10.0000 mg | ORAL_CAPSULE | Freq: Every day | ORAL | 2 refills | Status: DC
Start: 2021-04-17 — End: 2021-07-17

## 2021-04-17 MED ORDER — BUSPIRONE HCL 30 MG PO TABS: 30.0000 mg | ORAL_TABLET | Freq: Two times a day (BID) | ORAL | 2 refills | Status: DC

## 2021-04-17 MED ORDER — QUETIAPINE FUMARATE 200 MG PO TABS: 200.0000 mg | ORAL_TABLET | Freq: Every day | ORAL | 2 refills | Status: DC

## 2021-04-17 MED ORDER — HYDROXYZINE HCL 25 MG PO TABS: 25.0000 mg | ORAL_TABLET | Freq: Three times a day (TID) | ORAL | 2 refills | Status: DC | PRN

## 2021-04-17 NOTE — Progress Notes (Signed)
BH MD/PA/NP OP Progress Note Virtual Visit via Telephone Note  I connected with Monica Huffman on 04/17/21 at  3:30 PM EDT by telephone and verified that I am speaking with the correct person using two identifiers.  Location: Patient: home Provider: Clinic   I discussed the limitations, risks, security and privacy concerns of performing an evaluation and management service by telephone and the availability of in person appointments. I also discussed with the patient that there may be a patient responsible charge related to this service. The patient expressed understanding and agreed to proceed.   I provided 30 minutes of non-face-to-face time during this encounter.   04/17/2021 10:49 AM Monica Huffman  MRN:  [id number]  Chief Complaint: " I'm still not sleeping well. I don"t think the trazodone is working"   HPI: 30 year old female seen today for follow up psychiatric evaluation.  She has a psychiatric history of bipolar disorder, schizoaffective disorder, anxiety, PTSD, alcohol use disorder, opioid use disorder, and cocaine use disorder.    Currently she is managed on BuSpar 15 mg 3 times a day, hydroxyzine 25 mg 3 times a day as needed, Seroquel 100 nightly, and trazodone 100 mg as needed for sleep.  She notes her medications are somewhat effective in managing her psychiatric conditions.   Today patient unable to log in virtually so her exam was done over the phone.  During assessment she was pleasant, cooperative, and engaged in conversation.  She informed provider that since her last visit she continues to have poor sleep.  She informed Clinical research associate that she does not believe trazodone is working and reports that she would like to discontinue it.  She notes that her sleep is disturbed and reports that she wakes up hourly.  Today provider conducted a GAD-7 and patient scored and a 13, at her last visit she scored an 8.  Provider also conducted a PHQ-9 and patient scored a 16, at her last visit she  scored a 5.  Patient notes that at times her mood fluctuates and endorses visual and auditory hallucinations.  She notes that she sees bad people, shadows, and hears voices that instruct her to harm herself.  She denies wanting to harm herself today.  She denies SI/HI or paranoia.   Today she is agreeable to increasing Seroquel 100 mg to 200 mg to help manage sleep, mood, sleep and psychosis.  At this time she notes that she would like to discontinue trazodone.  She will start Prozac 10 mg daily to help manage symptoms of anxiety and depression.  Patient notes that she currently weighs 390 pounds.  She notes that she does not want to be on an antidepressant that may increase her weight).  She is also agreeable to increasing BuSpar 15 mg daily to use 30 mg twice daily to help manage symptoms of anxiety and depression.  She will follow-up with outpatient counseling for therapy.  No other concerns noted at this time.   . Visit Diagnosis:    ICD-10-CM   1. Schizoaffective disorder, bipolar type (HCC)  F25.0 FLUoxetine (PROZAC) 10 MG capsule    QUEtiapine (SEROQUEL) 200 MG tablet  2. GAD (generalized anxiety disorder)  F41.1 FLUoxetine (PROZAC) 10 MG capsule    hydrOXYzine (ATARAX/VISTARIL) 25 MG tablet    busPIRone (BUSPAR) 30 MG tablet    Past Psychiatric History: Schizoaffective, PTSD, alcohol use disorder, opioid use disorder, and cocaine use disorder Past Medical History:  Past Medical History:  Diagnosis Date  . Ankle fracture   .  Anxiety   . Bipolar disorder (HCC)   . Depression    History reviewed. No pertinent surgical history.  Family Psychiatric History: Mother depression, anxiety, and poly substance use (cocaine and alcohol), Father former heroin abuse (25 years sober), sister (herion use), sister (heroin use), third sister (sober from alcohol for 2 years), brother schizophrenia and (recovering from meth abuse), maternal/paternal grandfather substance use  Family History:  Family  History  Problem Relation Age of Onset  . Ovarian cancer Mother   . Alcoholism Mother   . Drug abuse Mother   . Drug abuse Sister     Social History:  Social History   Socioeconomic History  . Marital status: Single    Spouse name: Not on file  . Number of children: Not on file  . Years of education: Not on file  . Highest education level: Not on file  Occupational History  . Not on file  Tobacco Use  . Smoking status: Current Every Day Smoker    Packs/day: 1.00    Years: 10.00    Pack years: 10.00    Types: Cigarettes  . Smokeless tobacco: Current User  Substance and Sexual Activity  . 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 Drug use: No    Comment: Pt reports that she has been clean from pills and cocaine since Septemeber 27, 2014  . Sexual activity: Not Currently  Other Topics Concern  . Not on file  Social History Narrative  . Not on file   Social Determinants of Health   Financial Resource Strain: Not on file  Food Insecurity: Not on file  Transportation Needs: Not on file  Physical Activity: Not on file  Stress: Not on file  Social Connections: Not on file    Allergies:  Allergies  Allergen Reactions  . Depakote Er [Divalproex Sodium Er] Swelling    Metabolic Disorder Labs: Lab Results  Component Value Date   HGBA1C 5.5 10/20/2015   MPG 111 10/20/2015   No results found for: PROLACTIN Lab Results  Component Value Date   CHOL (H) 09/19/2008    205        ATP III CLASSIFICATION:  <200     mg/dL   Desirable  [id number]  mg/dL   Borderline High  >=914>=240    mg/dL   High   TRIG 782246 (H) 95/62/130809/22/2009   HDL 21 (L) 09/19/2008   CHOLHDL 9.8 09/19/2008   VLDL 49 (H) 09/19/2008   LDLCALC (H) 09/19/2008    135        Total Cholesterol/HDL:CHD Risk Coronary Heart Disease Risk Table                     Men   Women  1/2 Average Risk   3.4   3.3     Therapeutic Level Labs: Lab  Results  Component Value Date   LITHIUM 0.07 (L) 12/14/2015   Lab Results  Component Value Date   VALPROATE <1.0 Result repeated and verified. (L) 09/16/2008   VALPROATE 35.5 (L) 01/30/2008   No components found for:  CBMZ  Current Medications: Current Outpatient Medications  Medication Sig Dispense Refill  . FLUoxetine (PROZAC) 10 MG capsule Take 1 capsule (10 mg total) by mouth daily. 30 capsule 2  . busPIRone (BUSPAR) 30 MG tablet Take 1 tablet (30 mg total) by mouth 2 (two) times daily. 60 tablet 2  . hydrOXYzine (  ATARAX/VISTARIL) 25 MG tablet Take 1 tablet (25 mg total) by mouth 3 (three) times daily as needed for anxiety. 90 tablet 2  . ibuprofen (ADVIL,MOTRIN) 800 MG tablet Take 1 tablet (800 mg total) by mouth 3 (three) times daily. 21 tablet 0  . QUEtiapine (SEROQUEL) 200 MG tablet Take 1 tablet (200 mg total) by mouth at bedtime. 30 tablet 2   No current facility-administered medications for this visit.     Musculoskeletal: Strength & Muscle Tone: Unable to assess due to telephone visit Gait & Station: Unable to assess due to telephone visit Patient leans: N/A  Psychiatric Specialty Exam: Review of Systems  There were no vitals taken for this visit.There is no height or weight on file to calculate BMI.  General Appearance: Unable to assess due to telephone visit  Eye Contact:  Unable to assess due to telephone visit  Speech:  Clear and Coherent and Normal Rate  Volume:  Normal  Mood:  Anxious and Depressed  Affect:  Appropriate and Congruent  Thought Process:  Coherent, Goal Directed and Linear  Orientation:  NA  Thought Content: Logical and Hallucinations: Auditory Visual   Suicidal Thoughts:  No  Homicidal Thoughts:  No  Memory:  Immediate;   Good Recent;   Good Remote;   Good  Judgement:  Good  Insight:  Good  Psychomotor Activity:  Normal  Concentration:  Concentration: Good and Attention Span: Good  Recall:  Good  Fund of Knowledge: Good  Language:  Good  Akathisia:  No  Handed:  Right  AIMS (if indicated): Not done  Assets:  Communication Skills Desire for Improvement Financial Resources/Insurance Housing Social Support  ADL's:  Intact  Cognition: WNL  Sleep:  Fair   Screenings: AIMS   Flowsheet Row Ad[facility] (Discharged) [facility] Admission (Discharged) from [facility] Admission (Discharged) from [facility]2015 [facility]  AIMS Total Score 0 0 0    AUDIT   Flowsheet Row Admission (Discharged) from [facility] Admission (Discharged) from [facility] Admission (Discharged) from OP Visit from 05/09/2015 [facility]  Alcohol Use Disorder Identification Test Final Score (AUDIT) 0 2 4    GAD-7   Flowsheet Row Video Visit from 04/17/2021 in [facility] Video Visit from 01/01/2021 in [facility] Counselor from 10/23/2020 in [facility]  Total GAD-7 Score 13 8 14     PHQ2-9   Flowsheet Row Video Visit from 04/17/2021 in [facility] Video Visit from 01/01/2021 in [facility] Counselor from 10/23/2020 in [facility]  PHQ-2 Total Score 3 0 5  PHQ-9 Total Score 16 5 14     Flowsheet Row Video Visit from 04/17/2021 in [facility]  C-SSRS RISK CATEGORY Error: Q7 should not be populated when Q6 is No       Assessment and Plan: Patient endorses symptoms of insomnia, psychosis, anxiety, and depression.  Today she is agreeable to increasing Seroquel 100 mg to 200 mg to help manage sleep, mood, and psychosis.  She is also agreeable to  starting Prozac 10 mg daily to help manage symptoms of anxiety and depression.  She will increase BuSpar 15 mg 3  times daily to this 30 mg twice daily.  At this time she notes that she would like to discontinue trazodone.  She will continue all other medication as prescribed  1. Schizoaffective disorder, bipolar type (HCC)  Start- FLUoxetine (PROZAC) 10 MG capsule; Take 1 capsule (10 mg total) by mouth daily.  Dispense: 30 capsule; Refill: 2 Increased- QUEtiapine (SEROQUEL) 200 MG tablet; Take 1 tablet (200 mg total) by mouth at bedtime.  Dispense: 30 tablet; Refill: 2  2. GAD (generalized anxiety disorder)  Start- FLUoxetine (PROZAC) 10 MG capsule; Take 1 capsule (10 mg total) by mouth daily.  Dispense: 30 capsule; Refill: 2 Continue- hydrOXYzine (ATARAX/VISTARIL) 25 MG tablet; Take 1 tablet (25 mg total) by mouth 3 (three) times daily as needed for anxiety.  Dispense: 90 tablet; Refill: 2 Increase- busPIRone (BUSPAR) 30 MG tablet; Take 1 tablet (30 mg total) by mouth 2 (two) times daily.  Dispense: 60 tablet; Refill: 2    Follow-up in 3 months Follow-up with therapy    Shanna Cisco, NP 04/17/2021, 10:49 AM

## 2021-07-17 ENCOUNTER — Other Ambulatory Visit: Payer: Self-pay

## 2021-07-17 ENCOUNTER — Encounter (HOSPITAL_COMMUNITY): Payer: Self-pay | Admitting: Psychiatry

## 2021-07-17 ENCOUNTER — Telehealth (INDEPENDENT_AMBULATORY_CARE_PROVIDER_SITE_OTHER): Payer: No Typology Code available for payment source | Admitting: Psychiatry

## 2021-07-17 DIAGNOSIS — F411 Generalized anxiety disorder: Secondary | ICD-10-CM | POA: Diagnosis not present

## 2021-07-17 DIAGNOSIS — F250 Schizoaffective disorder, bipolar type: Secondary | ICD-10-CM | POA: Diagnosis not present

## 2021-07-17 MED ORDER — QUETIAPINE FUMARATE 200 MG PO TABS: 200.0000 mg | ORAL_TABLET | Freq: Every day | ORAL | 3 refills | Status: DC

## 2021-07-17 MED ORDER — FLUOXETINE HCL 20 MG PO CAPS: 20.0000 mg | ORAL_CAPSULE | Freq: Every day | ORAL | 3 refills | Status: DC

## 2021-07-17 MED ORDER — BUSPIRONE HCL 30 MG PO TABS: 30.0000 mg | ORAL_TABLET | Freq: Two times a day (BID) | ORAL | 3 refills | Status: DC

## 2021-07-17 NOTE — Progress Notes (Signed)
BH MD/PA/NP OP Progress Note Virtual Visit via Video Note  I connected with Monica Huffman on 07/17/21 at 10:30 AM EDT by a video enabled telemedicine application and verified that I am speaking with the correct person using two identifiers.  Location: Patient: Home Provider: Clinic   I discussed the limitations of evaluation and management by telemedicine and the availability of in person appointments. The patient expressed understanding and agreed to proceed.  I provided 30 minutes of non-face-to-face time during this encounter.     07/17/2021 10:37 AM Relda Olegario ShearerM Scobey  MRN:  [id number]  Chief Complaint: "I have been sleeping better but I have been more anxious"   HPI: 30 year old female seen today for follow up psychiatric evaluation.  She has a psychiatric history of bipolar disorder, schizoaffective disorder, anxiety, PTSD, alcohol use disorder, opioid use disorder, and cocaine use disorder.    Currently she is managed on BuSpar 30 mg twice daily, hydroxyzine 25 mg 3 times a day as needed,  and Seroquel 200 nightly.  She notes that she discontinued hydroxyzine because it was ineffective and reports that her other medications are somewhat effective in managing her psychiatric conditions.   Today patient is well groomed, pleasant, cooperative, engaged in conversation, and maintained eye contact.  She informed provider that since her last visit she has been sleeping better noting she sleeps 8 hours but has increased anxiety. She notes that she worries about things being out of order and notes that when something is out of place she has to fix or clean it (noting she washes dishes 3 times daily). She informed Clinical research associatewriter that on the weekend she watches 3 children and notes that her anxiety is more pronounced. She reports that when she is overly anxious she hears voices but reports that her hallucinations has improved since increasing Seroquel and only occurs when anxious.  Today provider conducted a  GAD-7 and patient scored and a 16, at her last visit she scored an 13.  Provider also conducted a PHQ-9 and patient scored a 15, at her last visit she scored a 16.  She notes that she has had passive SI for years but denies wanting to hurt herself today.   She denies SI/HI or paranoia.   Patient informed writer that recently she was placed on Ozempic and metformin.  She notes that she is lost 35 pounds and feeling physically better.  Today she is agreeable to increasing Prozac 10 mg to 20 mg daily to help manage symptoms of anxiety and depression.  She will continue all other medications as prescribed and will follow-up with outpatient counseling for therapy.  No other concerns noted at this time.   . Visit Diagnosis:    ICD-10-CM   1. GAD (generalized anxiety disorder)  F41.1 busPIRone (BUSPAR) 30 MG tablet    FLUoxetine (PROZAC) 20 MG capsule    2. Schizoaffective disorder, bipolar type (HCC)  F25.0 FLUoxetine (PROZAC) 20 MG capsule    QUEtiapine (SEROQUEL) 200 MG tablet      Past Psychiatric History: Schizoaffective, PTSD, alcohol use disorder, opioid use disorder, and cocaine use disorder Past Medical History:  Past Medical History:  Diagnosis Date   Ankle fracture    Anxiety    Bipolar disorder (HCC)    Depression    History reviewed. No pertinent surgical history.  Family Psychiatric History: Mother depression, anxiety, and poly substance use (cocaine and alcohol), Father former heroin abuse (25 years sober), sister (herion use), sister (heroin use), third sister (sober  from alcohol for 2 years), brother schizophrenia and (recovering from meth abuse), maternal/paternal grandfather substance use  Family History:  Family History  Problem Relation Age of Onset   Ovarian cancer Mother    Alcoholism Mother    Drug abuse Mother    Drug abuse Sister     Social History:  Social History   Socioeconomic History   Marital status: Single    Spouse name: Not on file   Number of  children: Not on file   Years of education: Not on file   Highest education level: Not on file  Occupational History   Not on file  Tobacco Use   Smoking status: Every Day    Packs/day: 1.00    Years: 10.00    Pack years: 10.00    Types: Cigarettes   Smokeless tobacco: Current  Substance and Sexual Activity   Alcohol use: No    Comment: Individual reports that she has been clean from alcohol since Septemeber 27,2014   Drug use: No    Comment: Pt reports that she has been clean from pills and cocaine since Septemeber 27, 2014   Sexual activity: Not Currently  Other Topics Concern   Not on file  Social History Narrative   Not on file   Social Determinants of Health   Financial Resource Strain: Not on file  Food Insecurity: Not on file  Transportation Needs: Not on file  Physical Activity: Not on file  Stress: Not on file  Social Connections: Not on file    Allergies:  Allergies  Allergen Reactions   Depakote Er [Divalproex Sodium Er] Swelling    Metabolic Disorder Labs: Lab Results  Component Value Date   HGBA1C 5.5 10/20/2015   MPG 111 10/20/2015   No results found for: PROLACTIN Lab Results  Component Value Date   CHOL (H) 09/19/2008    205        ATP III CLASSIFICATION:  <200     mg/dL   Desirable  619-509  mg/dL   Borderline High  >=326    mg/dL   High   TRIG 712 (H) 45/80/9983   HDL 21 (L) 09/19/2008   CHOLHDL 9.8 09/19/2008   VLDL 49 (H) 09/19/2008   LDLCALC (H) 09/19/2008    135        Total Cholesterol/HDL:CHD Risk Coronary Heart Disease Risk Table                     Men   Women  1/2 Average Risk   3.4   3.3     Therapeutic Level Labs: Lab Results  Component Value Date   LITHIUM 0.07 (L) 12/14/2015   Lab Results  Component Value Date   VALPROATE <1.0 Result repeated and verified. (L) 09/16/2008   VALPROATE 35.5 (L) 01/30/2008   No components found for:  CBMZ  Current Medications: Current Outpatient Medications  Medication Sig  Dispense Refill   busPIRone (BUSPAR) 30 MG tablet Take 1 tablet (30 mg total) by mouth 2 (two) times daily. 60 tablet 3   FLUoxetine (PROZAC) 20 MG capsule Take 1 capsule (20 mg total) by mouth daily. 30 capsule 3   QUEtiapine (SEROQUEL) 200 MG tablet Take 1 tablet (200 mg total) by mouth at bedtime. 30 tablet 3   No current facility-administered medications for this visit.     Musculoskeletal: Strength & Muscle Tone:  Unable to assess due to telehealth visit Gait & Station:  Unable to assess due to telehealth  visit Patient leans: N/A  Psychiatric Specialty Exam: Review of Systems  There were no vitals taken for this visit.There is no height or weight on file to calculate BMI.  General Appearance: Well Groomed  Eye Contact:  Good  Speech:  Clear and Coherent and Normal Rate  Volume:  Normal  Mood:  Anxious and Depressed  Affect:  Appropriate and Congruent  Thought Process:  Coherent, Goal Directed and Linear  Orientation:  NA  Thought Content: Logical and Hallucinations: Auditory Only has when anxious    Suicidal Thoughts:  Yes.  without intent/plan  Homicidal Thoughts:  No  Memory:  Immediate;   Good Recent;   Good Remote;   Good  Judgement:  Good  Insight:  Good  Psychomotor Activity:  Normal  Concentration:  Concentration: Good and Attention Span: Good  Recall:  Good  Fund of Knowledge: Good  Language: Good  Akathisia:  No  Handed:  Right  AIMS (if indicated): Not done  Assets:  Communication Skills Desire for Improvement Financial Resources/Insurance Housing Social Support  ADL's:  Intact  Cognition: WNL  Sleep:  Good   Screenings: AIMS    Flowsheet Row Ad[facility] (Discharged) [facility] Admission (Discharged) from [facility] Admission (Discharged) from [facility]  AIMS Total  Score 0 0 0      AUDIT    Flowsheet Row Admission (Discharged) [facility] Admission (Discharged) from [facility] Admission (Discharged) from OP Visit from 05[facility]09/2015 [facility]  Alcohol Use Disorder Identification Test Final Score (AUDIT) 0 2 4      GAD-7    Flowsheet Row Video Visit from 07/17/2021 in [facility] Video Visit from 04/17/2021 in [facility] Video Visit from 01/01/2021 in [facility] Counselor from 10/23/2020 in [facility]  Total GAD-7 Score 16 13 8 14       PHQ2-9    Flowsheet Row Video Visit from 07/17/2021 in [facility] Video Visit from 04/17/2021 in [facility] Video Visit from 01/01/2021 in [facility] Counselor from 10/23/2020 in [facility]  PHQ-2 Total Score 5 3 0 5  PHQ-9 Total Score 15 16 5 14       Flowsheet Row Video Visit from 07/17/2021 in [facility] Video Visit from 04/17/2021 in [facility]  C-SSRS RISK CATEGORY Error: Q7 should not be populated when Q6 is No Error: Q7 should not be populated when Q6 is No        Assessment and Plan: Patient notes that her sleep has improved but her anxiety and depression has not. When anxious she notes that she has AH. Today she is agreeable to increasing Prozac 10 mg to 20 mg daily to help manage symptoms of anxiety and depression.  At this time she would like to discontinue hydroxyzine. She will continue all other medications as prescribed and will follow-up with outpatient counseling for therapy. 1. Schizoaffective disorder, bipolar type (HCC)  Increased- FLUoxetine (PROZAC) 20 MG  capsule; Take 1 capsule (20 mg total) by mouth daily.  Dispense: 30 capsule; Refill: 3 Continue- QUEtiapine (SEROQUEL) 200 MG tablet;  Take 1 tablet (200 mg total) by mouth at bedtime.  Dispense: 30 tablet; Refill: 3  2. GAD (generalized anxiety disorder)  Increased- FLUoxetine (PROZAC) 20 MG capsule; Take 1 capsule (20 mg total) by mouth daily.  Dispense: 30 capsule; Refill: 3 Continue- hydrOXYzine (ATARAX/VISTARIL) 25 MG tablet; Take 1 tablet (25 mg total) by mouth 3 (three) times daily as needed for anxiety.  Dispense: 90 tablet; Refill: 3 Continue- busPIRone (BUSPAR) 30 MG tablet; Take 1 tablet (30 mg total) by mouth 2 (two) times daily.  Dispense: 60 tablet; Refill: 3    Follow-up in 3 months Follow-up with therapy    Shanna Cisco, NP 07/17/2021, 10:37 AM

## 2021-10-23 ENCOUNTER — Encounter (HOSPITAL_COMMUNITY): Payer: Self-pay | Admitting: Psychiatry

## 2021-10-23 ENCOUNTER — Telehealth (INDEPENDENT_AMBULATORY_CARE_PROVIDER_SITE_OTHER): Payer: No Payment, Other | Admitting: Psychiatry

## 2021-10-23 DIAGNOSIS — F411 Generalized anxiety disorder: Secondary | ICD-10-CM

## 2021-10-23 DIAGNOSIS — F250 Schizoaffective disorder, bipolar type: Secondary | ICD-10-CM | POA: Diagnosis not present

## 2021-10-23 MED ORDER — QUETIAPINE FUMARATE 200 MG PO TABS: 200.0000 mg | ORAL_TABLET | Freq: Every day | ORAL | 3 refills | Status: DC

## 2021-10-23 MED ORDER — FLUOXETINE HCL 20 MG PO CAPS: 20.0000 mg | ORAL_CAPSULE | Freq: Every day | ORAL | 3 refills | Status: DC

## 2021-10-23 MED ORDER — BUSPIRONE HCL 30 MG PO TABS: 30.0000 mg | ORAL_TABLET | Freq: Two times a day (BID) | ORAL | 3 refills | Status: DC

## 2021-10-23 NOTE — Progress Notes (Signed)
BH MD/PA/NP OP Progress Note Virtual Visit via Video Note  I connected with Monica Huffman on 10/23/21 at 10:30 AM EDT by a video enabled telemedicine application and verified that I am speaking with the correct person using two identifiers.  Location: Patient: Home Provider: Clinic   I discussed the limitations of evaluation and management by telemedicine and the availability of in person appointments. The patient expressed understanding and agreed to proceed.  I provided 30 minutes of non-face-to-face time during this encounter.     10/23/2021 11:01 AM Monica Huffman  MRN:  [id number]  Chief Complaint: "Things are going well"   HPI: 30 year old female seen today for follow up psychiatric evaluation.  She has a psychiatric history of bipolar disorder, schizoaffective disorder, anxiety, PTSD, alcohol use disorder, opioid use disorder, and cocaine use disorder.    Currently she is managed on BuSpar 30 mg twice daily, Prozac 20 mg daily,  and Seroquel 200 nightly.  She notes that her medications are effective in managing her psychiatric conditions.   Today patient is well groomed, pleasant, cooperative, engaged in conversation, and maintained eye contact.  She informed provider that since her last visit she started a new job at [employer].  She informed Clinical research associate that she finds enjoyment in her job.  She also notes that her mood has been stable and she is less anxious and depressed.  Provider conducted a GAD-7 and patient scored an 8, at her last visit she scored a 16.  Provider also conducted a PHQ-9 and patient scored a 3, at her last visit she scored a 15.  She endorses adequate sleep and reports that her appetite has reduced.  She informed Clinical research associate that she continues to see her PCP for diabetes and weight loss management.  She notes that her A1c level is currently managed.  She informed Clinical research associate that she is no longer taking Ozempic but now taking phentermine.  She informed Clinical research associate that  she has lost 70 pounds total.  Today she denies SI/HI/VAH, mania, paranoia.    No medication changes made today.  Patient agreeable to continue all medications as prescribed and will follow-up with outpatient counseling for therapy.  No other concerns noted at this time.   . Visit Diagnosis:    ICD-10-CM   1. GAD (generalized anxiety disorder)  F41.1 busPIRone (BUSPAR) 30 MG tablet    FLUoxetine (PROZAC) 20 MG capsule    2. Schizoaffective disorder, bipolar type (HCC)  F25.0 FLUoxetine (PROZAC) 20 MG capsule    QUEtiapine (SEROQUEL) 200 MG tablet      Past Psychiatric History: Schizoaffective, PTSD, alcohol use disorder, opioid use disorder, and cocaine use disorder Past Medical History:  Past Medical History:  Diagnosis Date   Ankle fracture    Anxiety    Bipolar disorder (HCC)    Depression    History reviewed. No pertinent surgical history.  Family Psychiatric History: Mother depression, anxiety, and poly substance use (cocaine and alcohol), Father former heroin abuse (25 years sober), sister (herion use), sister (heroin use), third sister (sober from alcohol for 2 years), brother schizophrenia and (recovering from meth abuse), maternal/paternal grandfather substance use  Family History:  Family History  Problem Relation Age of Onset   Ovarian cancer Mother    Alcoholism Mother    Drug abuse Mother    Drug abuse Sister     Social History:  Social History   Socioeconomic History   Marital status: Single    Spouse name: Not on file  Number of children: Not on file   Years of education: Not on file   Highest education level: Not on file  Occupational History   Not on file  Tobacco Use   Smoking status: Every Day    Packs/day: 1.00    Years: 10.00    Pack years: 10.00    Types: Cigarettes   Smokeless tobacco: Current  Substance and Sexual Activity   Alcohol use: No    Comment: Individual reports that she has been clean from alcohol since Septemeber 27,2014    Drug use: No    Comment: Pt reports that she has been clean from pills and cocaine since Septemeber 27, 2014   Sexual activity: Not Currently  Other Topics Concern   Not on file  Social History Narrative   Not on file   Social Determinants of Health   Financial Resource Strain: Not on file  Food Insecurity: Not on file  Transportation Needs: Not on file  Physical Activity: Not on file  Stress: Not on file  Social Connections: Not on file    Allergies:  Allergies  Allergen Reactions   Depakote Er [Divalproex Sodium Er] Swelling    Metabolic Disorder Labs: Lab Results  Component Value Date   HGBA1C 5.5 10/20/2015   MPG 111 10/20/2015   No results found for: PROLACTIN Lab Results  Component Value Date   CHOL (H) 09/19/2008    205        ATP III CLASSIFICATION:  <200     mg/dL   Desirable  161-096  mg/dL   Borderline High  >=045    mg/dL   High   TRIG 409 (H) 81/19/1478   HDL 21 (L) 09/19/2008   CHOLHDL 9.8 09/19/2008   VLDL 49 (H) 09/19/2008   LDLCALC (H) 09/19/2008    135        Total Cholesterol/HDL:CHD Risk Coronary Heart Disease Risk Table                     Men   Women  1/2 Average Risk   3.4   3.3     Therapeutic Level Labs: Lab Results  Component Value Date   LITHIUM 0.07 (L) 12/14/2015   Lab Results  Component Value Date   VALPROATE <1.0 Result repeated and verified. (L) 09/16/2008   VALPROATE 35.5 (L) 01/30/2008   No components found for:  CBMZ  Current Medications: Current Outpatient Medications  Medication Sig Dispense Refill   busPIRone (BUSPAR) 30 MG tablet Take 1 tablet (30 mg total) by mouth 2 (two) times daily. 60 tablet 3   FLUoxetine (PROZAC) 20 MG capsule Take 1 capsule (20 mg total) by mouth daily. 30 capsule 3   QUEtiapine (SEROQUEL) 200 MG tablet Take 1 tablet (200 mg total) by mouth at bedtime. 30 tablet 3   No current facility-administered medications for this visit.     Musculoskeletal: Strength & Muscle Tone:  Unable  to assess due to telehealth visit Gait & Station:  Unable to assess due to telehealth visit Patient leans: N/A  Psychiatric Specialty Exam: Review of Systems  There were no vitals taken for this visit.There is no height or weight on file to calculate BMI.  General Appearance: Well Groomed  Eye Contact:  Good  Speech:  Clear and Coherent and Normal Rate  Volume:  Normal  Mood:  Euthymic  Affect:  Appropriate and Congruent  Thought Process:  Coherent, Goal Directed and Linear  Orientation:  NA  Thought  Content: WDL and Logical   Suicidal Thoughts:  No  Homicidal Thoughts:  No  Memory:  Immediate;   Good Recent;   Good Remote;   Good  Judgement:  Good  Insight:  Good  Psychomotor Activity:  Normal  Concentration:  Concentration: Good and Attention Span: Good  Recall:  Good  Fund of Knowledge: Good  Language: Good  Akathisia:  No  Handed:  Right  AIMS (if indicated): Not done  Assets:  Communication Skills Desire for Improvement Financial Resources/Insurance Housing Social Support  ADL's:  Intact  Cognition: WNL  Sleep:  Good   Screenings: AIMS    Flowsheet Row Admission (Discharged) [facility] Admission (Discharged) from [facility] Admission (Discharged) from [facility] Visit from [facility]09/2015 [facility]  AIMS Total Score 0 0 0      AUDIT    Flowsheet Row Admission (Discharged) from [facility] Admission (Discharged) from [facility] Admission (Discharged) from OP Visit from 05[facility]09/2015 [facility]  Alcohol Use Disorder Identification Test Final Score (AUDIT) 0 2 4      GAD-7    Flowsheet Row Video Visit from 10/23/2021 in [facility] Video  Visit from 07/17/2021 in [facility] Video Visit from 04/17/2021 in [facility] Video Visit from 01/01/2021 in [facility] Counselor from 10/23/2020 in [facility]  Total GAD-7 Score 8 16 13 8 14       PHQ2-9    Flowsheet Row Video Visit from 10/23/2021 in [facility] Video Visit from 07/17/2021 in [facility] Video Visit from 04/17/2021 in [facility] Video Visit from 01/01/2021 in [facility] Counselor from 10/23/2020 in [facility]  PHQ-2 Total Score 0 5 3 0 5  PHQ-9 Total Score 3 15 16 5 14       Flowsheet Row Video Visit from 07/17/2021 in [facility] Video Visit from 04/17/2021 in [facility]  C-SSRS RISK CATEGORY Error: Q7 should not be populated when Q6 is No Error: Q7 should not be populated when Q6 is No        Assessment and Plan: Patient notes that she is doing well on her current medication regimen.  No medication changes made today.  Patient agreeable to continue medication as prescribed.    1. Schizoaffective disorder, bipolar type (HCC)  Continue- FLUoxetine (PROZAC) 20 MG capsule; Take 1 capsule (20 mg total) by mouth daily.  Dispense: 30 capsule; Refill: 3 Continue- QUEtiapine (SEROQUEL) 200 MG tablet; Take 1 tablet (200 mg total) by mouth at bedtime.  Dispense: 30 tablet; Refill: 3  2. GAD (generalized anxiety disorder)  Continue- FLUoxetine (PROZAC) 20 MG capsule; Take 1 capsule (20 mg total) by mouth daily.  Dispense: 30 capsule; Refill: 3 Continue- hydrOXYzine (ATARAX/VISTARIL) 25 MG tablet; Take 1 tablet (25 mg total) by mouth 3 (three) times daily as needed for anxiety.  Dispense: 90 tablet; Refill: 3 Continue- busPIRone (BUSPAR) 30 MG tablet; Take 1 tablet (30  mg total) by mouth 2 (two) times daily.  Dispense: 60 tablet; Refill: 3    Follow-up in 3 months Follow-up with therapy  Shanna Cisco, NP 10/23/2021, 11:01 AM

## 2022-01-17 ENCOUNTER — Encounter (HOSPITAL_COMMUNITY): Payer: Self-pay | Admitting: Psychiatry

## 2022-01-17 ENCOUNTER — Telehealth (INDEPENDENT_AMBULATORY_CARE_PROVIDER_SITE_OTHER): Payer: No Payment, Other | Admitting: Psychiatry

## 2022-01-17 DIAGNOSIS — F411 Generalized anxiety disorder: Secondary | ICD-10-CM

## 2022-01-17 DIAGNOSIS — F250 Schizoaffective disorder, bipolar type: Secondary | ICD-10-CM | POA: Diagnosis not present

## 2022-01-17 MED ORDER — BUSPIRONE HCL 30 MG PO TABS: 30.0000 mg | ORAL_TABLET | Freq: Two times a day (BID) | ORAL | 3 refills | Status: DC

## 2022-01-17 MED ORDER — FLUOXETINE HCL 20 MG PO CAPS: 20.0000 mg | ORAL_CAPSULE | Freq: Every day | ORAL | 3 refills | Status: DC

## 2022-01-17 MED ORDER — QUETIAPINE FUMARATE 200 MG PO TABS: 200.0000 mg | ORAL_TABLET | Freq: Every day | ORAL | 3 refills | Status: DC

## 2022-01-17 NOTE — Progress Notes (Signed)
BH MD/PA/NP OP Progress Note Virtual Visit via Telephone Note  I connected with Monica Huffman on 01/17/22 at  8:00 AM EST by telephone and verified that I am speaking with the correct person using two identifiers.  Location: Patient: home Provider: Clinic   I discussed the limitations, risks, security and privacy concerns of performing an evaluation and management service by telephone and the availability of in person appointments. I also discussed with the patient that there may be a patient responsible charge related to this service. The patient expressed understanding and agreed to proceed.   I provided 30 minutes of non-face-to-face time during this encounter.      01/17/2022 7:58 AM Monica Huffman  MRN:  [id number]  Chief Complaint: "I think everything is going well"   HPI: 31 year old female seen today for follow up psychiatric evaluation.  She has a psychiatric history of bipolar disorder, schizoaffective disorder, anxiety, PTSD, alcohol use disorder, opioid use disorder, and cocaine use disorder.    Currently she is managed on BuSpar 30 mg twice daily, Prozac 20 mg daily,  and Seroquel 200 nightly.  She notes that her medications are effective in managing her psychiatric conditions.   Today patient was unable to login virtually so assessment was done over the phone.  During exam she was pleasant, cooperative, and engaged in conversation.  She informed Probation officer that everything is going well and notes that she feels mentally stable.  Provider conducted a GAD-7 and patient scored a 4, at her last visit she scored 8. Provider also conducted a PHQ 9 and patient scored a 3, at her last visit she scored a 3. She reports that she continues to work at [employer] and notes that she had a good 90-day review.  She also informed Probation officer that she continues to be on her weight loss journey.  She however notes she no longer is takes phentermine.  She endorses adequate sleep and appetite.   Today she denies SI/HI/VAH, mania, paranoia.    No medication changes made today.  Patient agreeable to continue all medications as prescribed and will follow-up with outpatient counseling for therapy.  No other concerns noted at this time.   . Visit Diagnosis:    ICD-10-CM   1. GAD (generalized anxiety disorder)  F41.1 busPIRone (BUSPAR) 30 MG tablet    FLUoxetine (PROZAC) 20 MG capsule    2. Schizoaffective disorder, bipolar type (HCC)  F25.0 FLUoxetine (PROZAC) 20 MG capsule    QUEtiapine (SEROQUEL) 200 MG tablet      Past Psychiatric History: Schizoaffective, PTSD, alcohol use disorder, opioid use disorder, and cocaine use disorder Past Medical History:  Past Medical History:  Diagnosis Date   Ankle fracture    Anxiety    Bipolar disorder ([place])    Depression    No past surgical history on file.  Family Psychiatric History: Mother depression, anxiety, and poly substance use (cocaine and alcohol), Father former heroin abuse (25 years sober), sister (herion use), sister (heroin use), third sister (sober from alcohol for 2 years), brother schizophrenia and (recovering from meth abuse), maternal/paternal grandfather substance use  Family History:  Family History  Problem Relation Age of Onset   Ovarian cancer Mother    Alcoholism Mother    Drug abuse Mother    Drug abuse Sister     Social History:  Social History   Socioeconomic History   Marital status: Single    Spouse name: Not on file   Number of children: Not  on file   Years of education: Not on file   Highest education level: Not on file  Occupational History   Not on file  Tobacco Use   Smoking status: Every Day    Packs/day: 1.00    Years: 10.00    Pack years: 10.00    Types: Cigarettes   Smokeless tobacco: Current  Substance and Sexual Activity   Alcohol use: No    Comment: Individual reports that she has been clean from alcohol since Septemeber 27,2014   Drug use: No    Comment: Pt reports that she  has been clean from pills and cocaine since Septemeber 27, 2014   Sexual activity: Not Currently  Other Topics Concern   Not on file  Social History Narrative   Not on file   Social Determinants of Health   Financial Resource Strain: Not on file  Food Insecurity: Not on file  Transportation Needs: Not on file  Physical Activity: Not on file  Stress: Not on file  Social Connections: Not on file    Allergies:  Allergies  Allergen Reactions   Depakote Er [Divalproex Sodium Er] Swelling    Metabolic Disorder Labs: Lab Results  Component Value Date   HGBA1C 5.5 10/20/2015   MPG 111 10/20/2015   No results found for: PROLACTIN Lab Results  Component Value Date   CHOL (H) 09/19/2008    205        ATP III CLASSIFICATION:  <200     mg/dL   Desirable  200-239  mg/dL   Borderline High  >=240    mg/dL   High   TRIG 246 (H) 09/19/2008   HDL 21 (L) 09/19/2008   CHOLHDL 9.8 09/19/2008   VLDL 49 (H) 09/19/2008   LDLCALC (H) 09/19/2008    135        Total Cholesterol/HDL:CHD Risk Coronary Heart Disease Risk Table                     Men   Women  1/2 Average Risk   3.4   3.3     Therapeutic Level Labs: Lab Results  Component Value Date   LITHIUM 0.07 (L) 12/14/2015   Lab Results  Component Value Date   VALPROATE <1.0 Result repeated and verified. (L) 09/16/2008   VALPROATE 35.5 (L) 01/30/2008   No components found for:  CBMZ  Current Medications: Current Outpatient Medications  Medication Sig Dispense Refill   busPIRone (BUSPAR) 30 MG tablet Take 1 tablet (30 mg total) by mouth 2 (two) times daily. 60 tablet 3   FLUoxetine (PROZAC) 20 MG capsule Take 1 capsule (20 mg total) by mouth daily. 30 capsule 3   QUEtiapine (SEROQUEL) 200 MG tablet Take 1 tablet (200 mg total) by mouth at bedtime. 30 tablet 3   No current facility-administered medications for this visit.     Musculoskeletal: Strength & Muscle Tone:  Unable to assess due to telephone visit Gait &  Station:  Unable to assess due to telephone visit Patient leans: N/A  Psychiatric Specialty Exam: Review of Systems  There were no vitals taken for this visit.There is no height or weight on file to calculate BMI.  General Appearance:  Unable to assess due to telephone visit  Eye Contact:   Unable to assess due to telephone visit  Speech:  Clear and Coherent and Normal Rate  Volume:  Normal  Mood:  Euthymic  Affect:  Appropriate and Congruent  Thought Process:  Coherent, Goal  Directed and Linear  Orientation:  NA  Thought Content: WDL and Logical   Suicidal Thoughts:  No  Homicidal Thoughts:  No  Memory:  Immediate;   Good Recent;   Good Remote;   Good  Judgement:  Good  Insight:  Good  Psychomotor Activity:   Unable to assess due to telephone visit  Concentration:  Concentration: Good and Attention Span: Good  Recall:  Good  Fund of Knowledge: Good  Language: Good  Akathisia:  No  Handed:  Right  AIMS (if indicated): Not done  Assets:  Communication Skills Desire for Improvement Financial Resources/Insurance Housing Social Support  ADL's:  Intact  Cognition: WNL  Sleep:  Good   Screenings: [place] Admission (Discharged) from OP Visit from 12/13/2015 in [place] [facility] Admission (Discharged) from OP Visit from 10/18/2015 in [place] [facility] Admission (Discharged) from OP Visit from 05/09/2015 in [place] Total Score 0 0 0      AUDIT    Flowsheet Row Admission (Discharged) from OP Visit from 12/13/2015 in [place] [facility] Admission (Discharged) from OP Visit from 10/18/2015 in [place] [facility] Admission (Discharged) from OP Visit from 05/09/2015 in [employer]  Alcohol Use Disorder Identification Test Final Score (AUDIT) 0 2 4      GAD-7    Flowsheet Row Video Visit from 01/17/2022  in [facility] Video Visit from 10/23/2021 in [facility] Video Visit from 07/17/2021 in [facility] Video Visit from 04/17/2021 in [facility] Video Visit from 01/01/2021 in [facility]  Total GAD-7 Score 4 8 16 13 8       PHQ2-9    Flowsheet Row Video Visit from 01/17/2022 in [facility] Video Visit from 10/23/2021 in [facility] Video Visit from 07/17/2021 in [facility] Video Visit from 04/17/2021 in [facility] Video Visit from 01/01/2021 in [facility]  PHQ-2 Total Score 0 0 5 3 0  PHQ-9 Total Score 3 3 15 16 5       Flowsheet Row Video Visit from 07/17/2021 in [facility] Video Visit from 04/17/2021 in [place] Error: Q7 should not be populated when Q6 is No Error: Q7 should not be populated when Q6 is No        Assessment and Plan: Patient notes that she is doing well on her current medication regimen.  No medication changes made today.  Patient agreeable to continue medication as prescribed.    1. Schizoaffective disorder, bipolar type (HCC)  Continue- FLUoxetine (PROZAC) 20 MG capsule; Take 1 capsule (20 mg total) by mouth daily.  Dispense: 30 capsule; Refill: 3 Continue- QUEtiapine (SEROQUEL) 200 MG tablet; Take 1 tablet (200 mg total) by mouth at bedtime.  Dispense: 30 tablet; Refill: 3  2. GAD (generalized anxiety disorder)  Continue- FLUoxetine (PROZAC) 20 MG capsule; Take 1 capsule (20 mg total) by mouth daily.  Dispense: 30 capsule; Refill: 3 Continue- hydrOXYzine (ATARAX/VISTARIL) 25 MG tablet; Take 1 tablet (25 mg total) by mouth 3 (three) times daily as needed for anxiety.  Dispense: 90 tablet; Refill: 3 Continue-  busPIRone (BUSPAR) 30 MG tablet; Take 1 tablet (30 mg total) by mouth 2 (two)  times daily.  Dispense: 60 tablet; Refill: 3    Follow-up in 3 months Follow-up with therapy    Salley Slaughter, NP 01/17/2022, 7:58 AM

## 2022-03-28 ENCOUNTER — Telehealth (HOSPITAL_COMMUNITY): Payer: No Payment, Other | Admitting: Psychiatry

## 2022-05-23 ENCOUNTER — Telehealth (HOSPITAL_COMMUNITY): Payer: No Payment, Other | Admitting: Physician Assistant

## 2022-05-28 ENCOUNTER — Other Ambulatory Visit (HOSPITAL_COMMUNITY): Payer: Self-pay | Admitting: Psychiatry

## 2022-05-28 DIAGNOSIS — F250 Schizoaffective disorder, bipolar type: Secondary | ICD-10-CM

## 2022-05-28 DIAGNOSIS — F411 Generalized anxiety disorder: Secondary | ICD-10-CM

## 2022-06-06 ENCOUNTER — Telehealth (INDEPENDENT_AMBULATORY_CARE_PROVIDER_SITE_OTHER): Payer: No Payment, Other | Admitting: Physician Assistant

## 2022-06-06 DIAGNOSIS — F411 Generalized anxiety disorder: Secondary | ICD-10-CM | POA: Diagnosis not present

## 2022-06-06 DIAGNOSIS — F250 Schizoaffective disorder, bipolar type: Secondary | ICD-10-CM | POA: Diagnosis not present

## 2022-06-06 MED ORDER — HYDROXYZINE HCL 25 MG PO TABS: 25.0000 mg | ORAL_TABLET | Freq: Three times a day (TID) | ORAL | 3 refills | Status: DC | PRN

## 2022-06-06 MED ORDER — BUSPIRONE HCL 30 MG PO TABS: 30.0000 mg | ORAL_TABLET | Freq: Two times a day (BID) | ORAL | 3 refills | Status: DC

## 2022-06-06 MED ORDER — QUETIAPINE FUMARATE 200 MG PO TABS: 200.0000 mg | ORAL_TABLET | Freq: Every day | ORAL | 3 refills | Status: DC

## 2022-06-06 MED ORDER — FLUOXETINE HCL 20 MG PO CAPS: 20.0000 mg | ORAL_CAPSULE | Freq: Every day | ORAL | 3 refills | Status: DC

## 2022-06-06 NOTE — Progress Notes (Cosign Needed Addendum)
BH MD/PA/NP OP Progress Note  Virtual Visit via Telephone Note  I connected with Monica Huffman on 06/09/22 at  1:30 PM EDT by telephone and verified that I am speaking with the correct person using two identifiers.  Location: Patient: Home Provider: Clinic   I discussed the limitations, risks, security and privacy concerns of performing an evaluation and management service by telephone and the availability of in person appointments. I also discussed with the patient that there may be a patient responsible charge related to this service. The patient expressed understanding and agreed to proceed.  Follow Up Instructions:  I discussed the assessment and treatment plan with the patient. The patient was provided an opportunity to ask questions and all were answered. The patient agreed with the plan and demonstrated an understanding of the instructions.   The patient was advised to call back or seek an in-person evaluation if the symptoms worsen or if the condition fails to improve as anticipated.  I provided 9 minutes of non-face-to-face time during this encounter.  Meta HatchetUchenna E Dhaval Woo, PA   06/09/2022 11:28 PM Deserai Olegario ShearerM Hellwig  MRN:  [id number]  Chief Complaint: No chief complaint on file.  HPI:   Monica Huffman is a 31 year old female with a past psychiatric history significant for generalized anxiety disorder and schizoaffective disorder (bipolar type) [facility] via virtual telephone visit for follow-up medication management.  Patient was previously being seen by Dr. Doyne KeelParsons and was last seen on 01/17/2022.  Patient is currently being managed on the following medications:  Fluoxetine 20 mg daily Hydroxyzine 25 mg 3 times daily as needed Buspirone 30 mg 2 times daily Seroquel 200 mg at bedtime  Patient reports that her medications are going pretty well.  Patient denies the need for dosage adjustments at this time and is requesting  refills on all of her medications following the conclusion of his encounter.  Patient denies experiencing depressive symptoms at this time.  She still continues to endorse anxiety that appears to be situational in nature.  Patient reports having general anxiety around crowds but states that her anxiety is manageable at this time.  Patient stressors include being busy with work.  A GAD-7 screen was performed with the patient scoring a 3.  Patient is alert and oriented x 4, calm, cooperative, and fully engaged in conversation during the encounter.  Patient endorses good mood.  Patient denies suicidal or homicidal ideations.  She further denies auditory or visual hallucinations and does not appear to be responding to internal/external stimuli.  Patient endorses good sleep and receives on average 10 hours of sleep each night.  Patient endorses good appetite and eats on average 3 meals per day.  Patient denies alcohol consumption, tobacco use, and illicit drug use.  Visit Diagnosis:    ICD-10-CM   1. GAD (generalized anxiety disorder)  F41.1 busPIRone (BUSPAR) 30 MG tablet    FLUoxetine (PROZAC) 20 MG capsule    hydrOXYzine (ATARAX) 25 MG tablet    2. Schizoaffective disorder, bipolar type (HCC)  F25.0 FLUoxetine (PROZAC) 20 MG capsule    QUEtiapine (SEROQUEL) 200 MG tablet      Past Psychiatric History:  Generalized anxiety disorder Schizoaffective disorder (bipolar type) PTSD Alcohol use disorder Opioid use disorder Cocaine use disorder  Past Medical History:  Past Medical History:  Diagnosis Date   Ankle fracture    Anxiety    Bipolar disorder (HCC)    Depression    No  past surgical history on file.  Family Psychiatric History:  Mother depression, anxiety, and poly substance use (cocaine and alcohol) Father former heroin abuse (25 years sober) Sister (herion use) Sister (heroin use) Third sister (sober from alcohol for 2 years) Brother schizophrenia and (recovering from meth  abuse) Maternal/paternal grandfather substance use  Family History:  Family History  Problem Relation Age of Onset   Ovarian cancer Mother    Alcoholism Mother    Drug abuse Mother    Drug abuse Sister     Social History:  Social History   Socioeconomic History   Marital status: Single    Spouse name: Not on file   Number of children: Not on file   Years of education: Not on file   Highest education level: Not on file  Occupational History   Not on file  Tobacco Use   Smoking status: Every Day    Packs/day: 1.00    Years: 10.00    Total pack years: 10.00    Types: Cigarettes   Smokeless tobacco: Current  Substance and Sexual Activity   Alcohol use: No    Comment: Individual reports that she has been clean from alcohol since Septemeber 27,2014   Drug use: No    Comment: Pt reports that she has been clean from pills and cocaine since Septemeber 27, 2014   Sexual activity: Not Currently  Other Topics Concern   Not on file  Social History Narrative   Not on file   Social Determinants of Health   Financial Resource Strain: Not on file  Food Insecurity: Not on file  Transportation Needs: Not on file  Physical Activity: Not on file  Stress: Not on file  Social Connections: Not on file    Allergies:  Allergies  Allergen Reactions   Depakote Er [Divalproex Sodium Er] Swelling    Metabolic Disorder Labs: Lab Results  Component Value Date   HGBA1C 5.5 10/20/2015   MPG 111 10/20/2015   No results found for: "PROLACTIN" Lab Results  Component Value Date   CHOL (H) 09/19/2008    205        ATP III CLASSIFICATION:  <200     mg/dL   Desirable  295-284  mg/dL   Borderline High  >=132    mg/dL   High   TRIG 440 (H) 10/25/2535   HDL 21 (L) 09/19/2008   CHOLHDL 9.8 09/19/2008   VLDL 49 (H) 09/19/2008   LDLCALC (H) 09/19/2008    135        Total Cholesterol/HDL:CHD Risk Coronary Heart Disease Risk Table                     Men   Women  1/2 Average Risk    3.4   3.3   Lab Results  Component Value Date   TSH 5.274 (H) 10/20/2015   TSH 4.167 Test methodology is 3rd generation TSH 09/19/2008    Therapeutic Level Labs: Lab Results  Component Value Date   LITHIUM 0.07 (L) 12/14/2015   Lab Results  Component Value Date   VALPROATE <1.0 Result repeated and verified. (L) 09/16/2008   VALPROATE 35.5 (L) 01/30/2008   No results found for: "CBMZ"  Current Medications: Current Outpatient Medications  Medication Sig Dispense Refill   hydrOXYzine (ATARAX) 25 MG tablet Take 1 tablet (25 mg total) by mouth 3 (three) times daily as needed. 90 tablet 3   busPIRone (BUSPAR) 30 MG tablet Take 1 tablet (30 mg total)  by mouth 2 (two) times daily. 60 tablet 3   FLUoxetine (PROZAC) 20 MG capsule Take 1 capsule (20 mg total) by mouth daily. 30 capsule 3   QUEtiapine (SEROQUEL) 200 MG tablet Take 1 tablet (200 mg total) by mouth at bedtime. 30 tablet 3   No current facility-administered medications for this visit.     Musculoskeletal: Strength & Muscle Tone: Unable to assess due to telemedicine visit Gait & Station: Unable to assess due to telemedicine visit Patient leans: Unable to assess due to telemedicine was at that  Psychiatric Specialty Exam: Review of Systems  Psychiatric/Behavioral:  Negative for decreased concentration, dysphoric mood, hallucinations, self-injury, sleep disturbance and suicidal ideas. The patient is not nervous/anxious and is not hyperactive.     There were no vitals taken for this visit.There is no height or weight on file to calculate BMI.  General Appearance: Unable to assess due to telemedicine visit  Eye Contact: Unable to assess due to telemedicine visit  Speech:  Clear and Coherent and Normal Rate  Volume:  Normal  Mood:  Euthymic  Affect:  Appropriate  Thought Process:  Coherent and Descriptions of Associations: Intact  Orientation:  Full (Time, Place, and Person)  Thought Content: WDL   Suicidal Thoughts:   No  Homicidal Thoughts:  No  Memory:  Immediate;   Good Recent;   Good Remote;   Good  Judgement:  Good  Insight:  Good  Psychomotor Activity:  Normal  Concentration:  Concentration: Good and Attention Span: Good  Recall:  Good  Fund of Knowledge: Good  Language: Good  Akathisia:  No  Handed:  Right  AIMS (if indicated): not done  Assets:  Communication Skills Desire for Improvement Financial Resources/Insurance Housing Social Support  ADL's:  Intact  Cognition: WNL  Sleep:  Good   Screenings: AIMS    Flowsheet Row [facility]) [facility] (Discharged) from [facility]  AIMS Total Score 0 0 0      AUDIT    Flowsheet Row Admission (Discharged) [facility] Ad[facility] (Discharged) from [facility] Ad[facility] (Discharged) from OP Visit from 05/09/2015 [facility]  Alcohol Use Disorder Identification Test Final Score (AUDIT) 0 2 4      GAD-7    Flowsheet Row Video Visit from 06/06/2022 in [facility] Video Visit from 01/17/2022 in [facility] Video Visit from 10/23/2021 in [facility] Video Visit from 07/17/2021 in [facility] Video Visit from 04/17/2021 in [facility]  Total GAD-7 Score PHQ2-9    Flowsheet Row Video Visit from 06/06/2022 in [facility] Video Visit from 01/17/2022 in [facility] Video Visit from 10/23/2021 in [facility] Video Visit from  07/17/2021 in [facility] Video Visit from 04/17/2021 in [facility]  PHQ-2 Total Score 0 0 0 5 3  PHQ-9 Total Score -- Flowsheet Row Video Visit from 06/06/2022 in [facility] Video Visit from 07/17/2021  in [facility] Video Visit from 04/17/2021 in [facility]  C-SSRS RISK CATEGORY Low Risk Error: Q7 should not be populated when Q6 is No Error: Q7 should not be populated when Q6 is No        Assessment and Plan:   Sultana M. Collings is a 31 year old female with a past psychiatric history significant for generalized anxiety disorder and schizoaffective disorder (bipolar type) who presents to [facility] via virtual telephone visit for follow-up medication management.  Patient reports no issues or concerns regarding her current medication regimen at this time.  Patient denies the need for dosage adjustments at this time and is requesting refills on all of her medications following the conclusion of the encounter.  Patient denies experiencing depressive symptoms and states that her anxiety is overall manageable at this time.  Patient's medications to be prescribed to pharmacy of choice.  Collaboration of Care: Collaboration of Care: Medication Management AEB provider managing patient's psychiatric medications and Psychiatrist AEB patient being followed by mental health provider  Patient/Guardian was advised Release of Information must be obtained prior to any record release in order to collaborate their care with an outside provider. Patient/Guardian was advised if they have not already done so to contact the registration department to sign all necessary forms in order for [place] to release information regarding their care.   Consent: Patient/Guardian gives verbal consent for treatment and assignment of benefits  for services provided during this visit. Patient/Guardian expressed understanding and agreed to proceed.   1. GAD (generalized anxiety disorder)  - busPIRone (BUSPAR) 30 MG tablet; Take 1 tablet (30 mg total) by mouth 2 (two) times daily.  Dispense: 60 tablet; Refill: 3 - FLUoxetine (PROZAC) 20 MG capsule; Take 1 capsule (20 mg total) by mouth daily.  Dispense: 30 capsule; Refill: 3 - hydrOXYzine (ATARAX) 25 MG tablet; Take 1 tablet (25 mg total) by mouth 3 (three) times daily as needed.  Dispense: 90 tablet; Refill: 3  2. Schizoaffective disorder, bipolar type (HCC)  - FLUoxetine (PROZAC) 20 MG capsule; Take 1 capsule (20 mg total) by mouth daily.  Dispense: 30 capsule; Refill: 3 - QUEtiapine (SEROQUEL) 200 MG tablet; Take 1 tablet (200 mg total) by mouth at bedtime.  Dispense: 30 tablet; Refill: 3  Patient to follow up in 3 months Provider spent a total of 9 minutes with the patient/reviewing patient's chart  Meta Hatchet, PA 06/09/2022, 11:28 PM

## 2022-06-09 ENCOUNTER — Encounter (HOSPITAL_COMMUNITY): Payer: Self-pay | Admitting: Physician Assistant

## 2022-09-05 ENCOUNTER — Telehealth (INDEPENDENT_AMBULATORY_CARE_PROVIDER_SITE_OTHER): Payer: Federal, State, Local not specified - PPO | Admitting: Psychiatry

## 2022-09-05 ENCOUNTER — Encounter (HOSPITAL_COMMUNITY): Payer: Self-pay | Admitting: Psychiatry

## 2022-09-05 DIAGNOSIS — F411 Generalized anxiety disorder: Secondary | ICD-10-CM

## 2022-09-05 DIAGNOSIS — F250 Schizoaffective disorder, bipolar type: Secondary | ICD-10-CM | POA: Diagnosis not present

## 2022-09-05 MED ORDER — BUSPIRONE HCL 30 MG PO TABS: 30.0000 mg | ORAL_TABLET | Freq: Two times a day (BID) | ORAL | 3 refills | Status: DC

## 2022-09-05 MED ORDER — FLUOXETINE HCL 20 MG PO CAPS: 20.0000 mg | ORAL_CAPSULE | Freq: Every day | ORAL | 3 refills | Status: DC

## 2022-09-05 MED ORDER — QUETIAPINE FUMARATE 200 MG PO TABS: 200.0000 mg | ORAL_TABLET | Freq: Every day | ORAL | 3 refills | Status: DC

## 2022-09-05 MED ORDER — HYDROXYZINE HCL 25 MG PO TABS: 25.0000 mg | ORAL_TABLET | Freq: Three times a day (TID) | ORAL | 3 refills | Status: DC | PRN

## 2022-09-05 NOTE — Progress Notes (Signed)
BH MD/PA/NP OP Progress Note Virtual Visit via Telephone Note  I connected with Monica Huffman on 09/05/22 at  8:00 AM EDT by telephone and verified that I am speaking with the correct person using two identifiers.  Location: Patient: home Provider: Clinic   I discussed the limitations, risks, security and privacy concerns of performing an evaluation and management service by telephone and the availability of in person appointments. I also discussed with the patient that there may be a patient responsible charge related to this service. The patient expressed understanding and agreed to proceed.   I provided 30 minutes of non-face-to-face time during this encounter.      09/05/2022 8:42 AM Monica Huffman  MRN:  [id number]  Chief Complaint: "I'm doing okay"   HPI: 31 year old female seen today for follow up psychiatric evaluation.  She has a psychiatric history of bipolar disorder, schizoaffective disorder, anxiety, PTSD, alcohol use disorder, opioid use disorder, and cocaine use disorder.    Currently she is managed on BuSpar 30 mg twice daily, hydroxyzine 25 mg three times daily as needed, Prozac 20 mg daily,  and Seroquel 200 nightly.  She notes that her medications are effective in managing her psychiatric conditions.   Today patient was unable to login virtually so assessment was done over the phone.  During exam she was pleasant, cooperative, and engaged in conversation.  She informed Probation officer that she is doing okay.  She reports that her mood is stable and notes that she has minimal anxiety and depression.  Provider conducted a GAD-7 and patient scored a 2.  Provider also conducted PHQ-9 and patient scored a 1.  She endorses adequate sleep and appetite.  Today she denies SI/HI/, mania, paranoia.  Patient reports that at times she sees shadows or hearing voices but notes that it is minimal.  She informed Probation officer that she is able to cope with her hallucinations and request that medications  not be adjusted.  No medication changes made today.  Patient agreeable to continue all medications as prescribed and will follow-up with outpatient counseling for therapy.  No other concerns noted at this time.   . Visit Diagnosis:    ICD-10-CM   1. GAD (generalized anxiety disorder)  F41.1 busPIRone (BUSPAR) 30 MG tablet    FLUoxetine (PROZAC) 20 MG capsule    hydrOXYzine (ATARAX) 25 MG tablet    2. Schizoaffective disorder, bipolar type (HCC)  F25.0 FLUoxetine (PROZAC) 20 MG capsule    QUEtiapine (SEROQUEL) 200 MG tablet      Past Psychiatric History: Schizoaffective, PTSD, alcohol use disorder, opioid use disorder, and cocaine use disorder Past Medical History:  Past Medical History:  Diagnosis Date   Ankle fracture    Anxiety    Bipolar disorder ([place])    Depression    No past surgical history on file.  Family Psychiatric History: Mother depression, anxiety, and poly substance use (cocaine and alcohol), Father former heroin abuse (25 years sober), sister (herion use), sister (heroin use), third sister (sober from alcohol for 2 years), brother schizophrenia and (recovering from meth abuse), maternal/paternal grandfather substance use  Family History:  Family History  Problem Relation Age of Onset   Ovarian cancer Mother    Alcoholism Mother    Drug abuse Mother    Drug abuse Sister     Social History:  Social History   Socioeconomic History   Marital status: Single    Spouse name: Not on file   Number of children: Not on file  Years of education: Not on file   Highest education level: Not on file  Occupational History   Not on file  Tobacco Use   Smoking status: Every Day    Packs/day: 1.00    Years: 10.00    Total pack years: 10.00    Types: Cigarettes   Smokeless tobacco: Current  Substance and Sexual Activity   Alcohol use: No    Comment: Individual reports that she has been clean from alcohol since Septemeber 27,2014   Drug use: No    Comment: Pt  reports that she has been clean from pills and cocaine since Septemeber 27, 2014   Sexual activity: Not Currently  Other Topics Concern   Not on file  Social History Narrative   Not on file   Social Determinants of Health   Financial Resource Strain: Not on file  Food Insecurity: Not on file  Transportation Needs: Not on file  Physical Activity: Not on file  Stress: Not on file  Social Connections: Not on file    Allergies:  Allergies  Allergen Reactions   Depakote Er [Divalproex Sodium Er] Swelling    Metabolic Disorder Labs: Lab Results  Component Value Date   HGBA1C 5.5 10/20/2015   MPG 111 10/20/2015   No results found for: "PROLACTIN" Lab Results  Component Value Date   CHOL (H) 09/19/2008    205        ATP III CLASSIFICATION:  <200     mg/dL   Desirable  161-096  mg/dL   Borderline High  >=045    mg/dL   High   TRIG 409 (H) 81/19/1478   HDL 21 (L) 09/19/2008   CHOLHDL 9.8 09/19/2008   VLDL 49 (H) 09/19/2008   LDLCALC (H) 09/19/2008    135        Total Cholesterol/HDL:CHD Risk Coronary Heart Disease Risk Table                     Men   Women  1/2 Average Risk   3.4   3.3     Therapeutic Level Labs: Lab Results  Component Value Date   LITHIUM 0.07 (L) 12/14/2015   Lab Results  Component Value Date   VALPROATE <1.0 Result repeated and verified. (L) 09/16/2008   VALPROATE 35.5 (L) 01/30/2008   No results found for: "CBMZ"  Current Medications: Current Outpatient Medications  Medication Sig Dispense Refill   busPIRone (BUSPAR) 30 MG tablet Take 1 tablet (30 mg total) by mouth 2 (two) times daily. 60 tablet 3   FLUoxetine (PROZAC) 20 MG capsule Take 1 capsule (20 mg total) by mouth daily. 30 capsule 3   hydrOXYzine (ATARAX) 25 MG tablet Take 1 tablet (25 mg total) by mouth 3 (three) times daily as needed. 90 tablet 3   QUEtiapine (SEROQUEL) 200 MG tablet Take 1 tablet (200 mg total) by mouth at bedtime. 30 tablet 3   No current  facility-administered medications for this visit.     Musculoskeletal: Strength & Muscle Tone:  Unable to assess due to telephone visit Gait & Station:  Unable to assess due to telephone visit Patient leans: N/A  Psychiatric Specialty Exam: Review of Systems  There were no vitals taken for this visit.There is no height or weight on file to calculate BMI.  General Appearance:  Unable to assess due to telephone visit  Eye Contact:   Unable to assess due to telephone visit  Speech:  Clear and Coherent and Normal Rate  Volume:  Normal  Mood:  Euthymic  Affect:  Appropriate and Congruent  Thought Process:  Coherent, Goal Directed and Linear  Orientation:  NA  Thought Content: Logical and Hallucinations: Auditory Visual   Suicidal Thoughts:  No  Homicidal Thoughts:  No  Memory:  Immediate;   Good Recent;   Good Remote;   Good  Judgement:  Good  Insight:  Good  Psychomotor Activity:   Unable to assess due to telephone visit  Concentration:  Concentration: Good and Attention Span: Good  Recall:  Good  Fund of Knowledge: Good  Language: Good  Akathisia:  No  Handed:  Right  AIMS (if indicated): Not done  Assets:  Communication Skills Desire for Improvement Financial Resources/Insurance Housing Social Support  ADL's:  Intact  Cognition: WNL  Sleep:  Good   Screenings: [place] Admission (Discharged) from OP Visit from 12/13/2015 in [place] [facility] Admission (Discharged) from OP Visit from 10/18/2015 in [place] [facility] Admission (Discharged) from OP Visit from 05/09/2015 in [place] Total Score 0 0 0      AUDIT    Flowsheet Row Admission (Discharged) from OP Visit from 12/13/2015 in [place] [facility] Admission (Discharged) from OP Visit from 10/18/2015 in [place] [facility] Admission (Discharged) from OP Visit from  05/09/2015 in [place]  Alcohol Use Disorder Identification Test Final Score (AUDIT) 0 2 4      GAD-7    Flowsheet Row Video Visit from 09/05/2022 in [facility] Video Visit from 06/06/2022 in [facility] Video Visit from 01/17/2022 in [facility] Video Visit from 10/23/2021 in [facility] Video Visit from 07/17/2021 in [facility]  Total GAD-7 Score 2 3 4 8 16       PHQ2-9    Flowsheet Row Video Visit from 09/05/2022 in [facility] Video Visit from 06/06/2022 in [facility] Video Visit from 01/17/2022 in [facility] Video Visit from 10/23/2021 in [facility] Video Visit from 07/17/2021 in [facility]  PHQ-2 Total Score 0 0 0 0 5  PHQ-9 Total Score 1 -- 3 3 15       Flowsheet Row Video Visit from 06/06/2022 in [facility] Video Visit from 07/17/2021 in [facility] Video Visit from 04/17/2021 in [place] Risk Error: Q7 should not be populated when Q6 is No Error: Q7 should not be populated when Q6 is No        Assessment and Plan: Patient endorses symptoms of psychosis but reports that she is able to cope with her hallucinations.  Overall she notes that she is doing well on her current medication regimen.  No medication changes made today.  Patient agreeable to continue medication as prescribed.    1. Schizoaffective disorder, bipolar type (HCC)  Continue- FLUoxetine (PROZAC) 20 MG capsule; Take 1 capsule (20 mg total) by mouth daily.  Dispense: 30 capsule; Refill: 3 Continue- QUEtiapine (SEROQUEL) 200 MG tablet; Take 1 tablet (200 mg total) by mouth at bedtime.  Dispense: 30  tablet; Refill: 3  2. GAD (generalized anxiety disorder)  Continue- FLUoxetine (PROZAC) 20 MG capsule; Take 1 capsule (20 mg total) by mouth  daily.  Dispense: 30 capsule; Refill: 3 Continue- hydrOXYzine (ATARAX/VISTARIL) 25 MG tablet; Take 1 tablet (25 mg total) by mouth 3 (three) times daily as needed for anxiety.  Dispense: 90 tablet; Refill: 3 Continue- busPIRone (BUSPAR) 30 MG tablet; Take 1 tablet (30 mg total) by mouth 2 (two) times daily.  Dispense: 60 tablet; Refill: 3    Follow-up in 3 months Follow-up with therapy    Shanna Cisco, NP 09/05/2022, 8:42 AM

## 2022-12-05 ENCOUNTER — Encounter (HOSPITAL_COMMUNITY): Payer: Self-pay | Admitting: Psychiatry

## 2022-12-05 ENCOUNTER — Telehealth (INDEPENDENT_AMBULATORY_CARE_PROVIDER_SITE_OTHER): Payer: 59 | Admitting: Psychiatry

## 2022-12-05 DIAGNOSIS — F411 Generalized anxiety disorder: Secondary | ICD-10-CM | POA: Diagnosis not present

## 2022-12-05 DIAGNOSIS — F250 Schizoaffective disorder, bipolar type: Secondary | ICD-10-CM

## 2022-12-05 MED ORDER — QUETIAPINE FUMARATE 200 MG PO TABS: 200.0000 mg | ORAL_TABLET | Freq: Every day | ORAL | 3 refills | Status: DC

## 2022-12-05 MED ORDER — BUSPIRONE HCL 30 MG PO TABS: 30.0000 mg | ORAL_TABLET | Freq: Two times a day (BID) | ORAL | 3 refills | Status: DC

## 2022-12-05 MED ORDER — FLUOXETINE HCL 20 MG PO CAPS: 20.0000 mg | ORAL_CAPSULE | Freq: Every day | ORAL | 3 refills | Status: DC

## 2022-12-05 MED ORDER — HYDROXYZINE HCL 25 MG PO TABS: 25.0000 mg | ORAL_TABLET | Freq: Three times a day (TID) | ORAL | 3 refills | Status: DC | PRN

## 2022-12-05 NOTE — Progress Notes (Signed)
BH MD/PA/NP OP Progress Note Virtual Visit via Video Note  I connected with Monica Huffman on 12/05/22 at  8:30 AM EST by a video enabled telemedicine application and verified that I am speaking with the correct person using two identifiers.  Location: Patient: Home Provider: Clinic   I discussed the limitations of evaluation and management by telemedicine and the availability of in person appointments. The patient expressed understanding and agreed to proceed.  I provided 30 minutes of non-face-to-face time during this encounter.        12/05/2022 8:41 AM Monica Huffman  MRN:  [id number]  Chief Complaint: "I have been having a hard time sleeping due to stress"   HPI: 31 year old female seen today for follow up psychiatric evaluation.  She has a psychiatric history of bipolar disorder, schizoaffective disorder, anxiety, PTSD, alcohol use disorder, opioid use disorder, and cocaine use disorder.    Currently she is managed on BuSpar 30 mg twice daily, hydroxyzine 25 mg three times daily as needed, Prozac 20 mg daily,  and Seroquel 200 nightly.  She notes that her medications are effective in managing her psychiatric conditions.   Today she was pleasant, cooperative, engaged in conversation, and maintained eye contact. She notes that her sleep has been poor due to life stressors.  Patient informed Clinical research associate that recently her grandmother was placed in hospice care.  She also notes that she has family conflict with her mother and sister.  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  Despite these stressors patient notes that he has been trying to cope with her increasing anxiety.  Today provider conducted a GAD-7 and patient scored an 8, at her last visit she scored a 2.  Provider also conducted PHQ-9 the patient scored an 11, at her last visit she scored a 1.  She endorses fluctuations in sleep.  She notes that her appetite is good.  Today  she denies SI/HI/VAH, mania, paranoia.   Patient informed Clinical research associate that she has not taken hydroxyzine in a while and is agreeable to restarting hydroxyzine 25 mg 3 times daily.  She will continue her other medications as prescribed.  No other concerns noted at this time.   . Visit Diagnosis:    ICD-10-CM   1. GAD (generalized anxiety disorder)  F41.1 busPIRone (BUSPAR) 30 MG tablet    FLUoxetine (PROZAC) 20 MG capsule    hydrOXYzine (ATARAX) 25 MG tablet    2. Schizoaffective disorder, bipolar type (HCC)  F25.0 FLUoxetine (PROZAC) 20 MG capsule    QUEtiapine (SEROQUEL) 200 MG tablet      Past Psychiatric History: Schizoaffective, PTSD, alcohol use disorder, opioid use disorder, and cocaine use disorder Past Medical History:  Past Medical History:  Diagnosis Date   Ankle fracture    Anxiety    Bipolar disorder (HCC)    Depression    History reviewed. No pertinent surgical history.  Family Psychiatric History: Mother depression, anxiety, and poly substance use (cocaine and alcohol), Father former heroin abuse (25 years sober), sister (herion use), sister (heroin use), third sister (sober from alcohol for 2 years), brother schizophrenia and (recovering from meth abuse), maternal/paternal grandfather substance use  Family History:  Family History  Problem Relation Age of Onset   Ovarian cancer Mother    Alcoholism Mother    Drug abuse Mother    Drug abuse Sister     Social History:  Social History   Socioeconomic History  Marital status: Single    Spouse name: Not on file   Number of children: Not on file   Years of education: Not on file   Highest education level: Not on file  Occupational History   Not on file  Tobacco Use   Smoking status: Every Day    Packs/day: 1.00    Years: 10.00    Total pack years: 10.00    Types: Cigarettes   Smokeless tobacco: Current  Substance and Sexual Activity   Alcohol use: No    Comment: Individual reports that she has been clean  from alcohol since Septemeber 27,2014   Drug use: No    Comment: Pt reports that she has been clean from pills and cocaine since Septemeber 27, 2014   Sexual activity: Not Currently  Other Topics Concern   Not on file  Social History Narrative   Not on file   Social Determinants of Health   Financial Resource Strain: Not on file  Food Insecurity: Not on file  Transportation Needs: Not on file  Physical Activity: Not on file  Stress: Not on file  Social Connections: Not on file    Allergies:  Allergies  Allergen Reactions   Depakote Er [Divalproex Sodium Er] Swelling    Metabolic Disorder Labs: Lab Results  Component Value Date   HGBA1C 5.5 10/20/2015   MPG 111 10/20/2015   No results found for: "PROLACTIN" Lab Results  Component Value Date   CHOL (H) 09/19/2008    205        ATP III CLASSIFICATION:  <200     mg/dL   Desirable  130-865  mg/dL   Borderline High  >=784    mg/dL   High   TRIG 696 (H) 29/52/8413   HDL 21 (L) 09/19/2008   CHOLHDL 9.8 09/19/2008   VLDL 49 (H) 09/19/2008   LDLCALC (H) 09/19/2008    135        Total Cholesterol/HDL:CHD Risk Coronary Heart Disease Risk Table                     Men   Women  1/2 Average Risk   3.4   3.3     Therapeutic Level Labs: Lab Results  Component Value Date   LITHIUM 0.07 (L) 12/14/2015   Lab Results  Component Value Date   VALPROATE <1.0 Result repeated and verified. (L) 09/16/2008   VALPROATE 35.5 (L) 01/30/2008   No results found for: "CBMZ"  Current Medications: Current Outpatient Medications  Medication Sig Dispense Refill   busPIRone (BUSPAR) 30 MG tablet Take 1 tablet (30 mg total) by mouth 2 (two) times daily. 60 tablet 3   FLUoxetine (PROZAC) 20 MG capsule Take 1 capsule (20 mg total) by mouth daily. 30 capsule 3   hydrOXYzine (ATARAX) 25 MG tablet Take 1 tablet (25 mg total) by mouth 3 (three) times daily as needed. 90 tablet 3   QUEtiapine (SEROQUEL) 200 MG tablet Take 1 tablet (200 mg  total) by mouth at bedtime. 30 tablet 3   No current facility-administered medications for this visit.     Musculoskeletal: Strength & Muscle Tone: within normal limits and telehealth visit Gait & Station: normal, telehealth visit Patient leans: N/A  Psychiatric Specialty Exam: Review of Systems  There were no vitals taken for this visit.There is no height or weight on file to calculate BMI.  General Appearance: Well Groomed  Eye Contact:  Good  Speech:  Clear and Coherent and Normal  Rate  Volume:  Normal  Mood:  Anxious and Depressed  Affect:  Appropriate and Congruent  Thought Process:  Coherent, Goal Directed and Linear  Orientation:  NA  Thought Content: WDL and Logical   Suicidal Thoughts:  No  Homicidal Thoughts:  No  Memory:  Immediate;   Good Recent;   Good Remote;   Good  Judgement:  Good  Insight:  Good  Psychomotor Activity:  Normal  Concentration:  Concentration: Good and Attention Span: Good  Recall:  Good  Fund of Knowledge: Good  Language: Good  Akathisia:  No  Handed:  Right  AIMS (if indicated): Not done  Assets:  Communication Skills Desire for Improvement Financial Resources/Insurance Housing Social Support  ADL's:  Intact  Cognition: WNL  Sleep:  Fair   Screenings: AIMS    Flowsheet Row [facility]) [facility] (Discharged) from [facility]  AIMS Total Score 0 0 0      AUDIT    Flowsheet Row Admission (Discharged) [facility] Ad[facility] (Discharged[facility] from [facility] Ad[facility] (Discharged) from [facility] Visit from 05[facility]/2015 [facility]  Alcohol Use Disorder  Identification Test Final Score (AUDIT) 0 2 4      GAD-7    Flowsheet Row Video Visit from 12/05/2022 in [facility] Video Visit from 09/05/2022 in [facility] Video Visit from 06/06/2022 in [facility] Video Visit from 01/17/2022 in [facility] Video Visit from 10/23/2021 in [facility]  Total GAD-7 Score 18 2 3 4 8       PHQ2-9    Flowsheet Row Video Visit from 12/05/2022 in [facility] Video Visit from 09/05/2022 in [facility] Video Visit from 06/06/2022 in [facility] Video Visit from 01/17/2022 in [facility] Video Visit from 10/23/2021 in [facility]  PHQ-2 Total Score 4 0 0 0 0  PHQ-9 Total Score 11 1 -- 3 3      Flowsheet Row Video Visit from 06/06/2022 in [facility] Video Visit from 07/17/2021 in [facility] Video Visit from 04/17/2021 in [facility]  C-SSRS RISK CATEGORY Low Risk Error: Q7 should not be populated when Q6 is No Error: Q7 should not be populated when Q6 is No        Assessment and Plan: Patient endorses increased anxiety and depression due to life stressors.  She notes that she has not taken hydroxyzine in a few months.  Today she is agreeable to restarting hydroxyzine 25 mg 3 times daily as needed to help manage anxiety.  She will continue her other medications as prescribed.    1. Schizoaffective disorder, bipolar type (HCC)  Continue- FLUoxetine (PROZAC) 20 MG capsule; Take 1 capsule (20 mg total) by mouth daily.  Dispense: 30 capsule; Refill: 3 Continue- QUEtiapine (SEROQUEL) 200 MG tablet; Take 1 tablet (200 mg total) by mouth at bedtime.  Dispense: 30 tablet; Refill: 3  2. GAD (generalized anxiety  disorder)  Continue- FLUoxetine (PROZAC) 20 MG capsule; Take 1 capsule (20 mg total)  by mouth daily.  Dispense: 30 capsule; Refill: 3 Restart- hydrOXYzine (ATARAX/VISTARIL) 25 MG tablet; Take 1 tablet (25 mg total) by mouth 3 (three) times daily as needed for anxiety.  Dispense: 90 tablet; Refill: 3 Continue- busPIRone (BUSPAR) 30 MG tablet; Take 1 tablet (30 mg total) by mouth 2 (two) times daily.  Dispense: 60 tablet; Refill: 3    Follow-up in 3 months Follow-up with therapy    Shanna CiscoBrittney E Winnona Wargo, NP 12/05/2022, 8:41 AM

## 2023-03-06 ENCOUNTER — Telehealth (INDEPENDENT_AMBULATORY_CARE_PROVIDER_SITE_OTHER): Payer: 59 | Admitting: Psychiatry

## 2023-03-06 ENCOUNTER — Encounter (HOSPITAL_COMMUNITY): Payer: Self-pay | Admitting: Psychiatry

## 2023-03-06 DIAGNOSIS — F411 Generalized anxiety disorder: Secondary | ICD-10-CM | POA: Diagnosis not present

## 2023-03-06 DIAGNOSIS — F250 Schizoaffective disorder, bipolar type: Secondary | ICD-10-CM | POA: Diagnosis not present

## 2023-03-06 MED ORDER — FLUOXETINE HCL 20 MG PO CAPS: 20.0000 mg | ORAL_CAPSULE | Freq: Every day | ORAL | 3 refills | Status: DC

## 2023-03-06 MED ORDER — BUSPIRONE HCL 30 MG PO TABS: 30.0000 mg | ORAL_TABLET | Freq: Two times a day (BID) | ORAL | 3 refills | Status: DC

## 2023-03-06 MED ORDER — QUETIAPINE FUMARATE 200 MG PO TABS: 200.0000 mg | ORAL_TABLET | Freq: Every day | ORAL | 3 refills | Status: DC

## 2023-03-06 NOTE — Progress Notes (Signed)
BH MD/PA/NP OP Progress Note Virtual Visit via Video Note  I connected with Monica Huffman on 03/06/23 at  8:30 AM EST by a video enabled telemedicine application and verified that I am speaking with the correct person using two identifiers.  Location: Patient: Car Provider: Clinic   I discussed the limitations of evaluation and management by telemedicine and the availability of in person appointments. The patient expressed understanding and agreed to proceed.  I provided 30 minutes of non-face-to-face time during this encounter.        03/06/2023 8:29 AM Monica Huffman  MRN:  [id number]  Chief Complaint: "I have been tired"   HPI: 32 year old female seen today for follow up psychiatric evaluation.  She has a psychiatric history of bipolar disorder, schizoaffective disorder, anxiety, PTSD, alcohol use disorder, opioid use disorder, and cocaine use disorder.    Currently she is managed on BuSpar 30 mg twice daily, hydroxyzine 25 mg three times daily as needed, Prozac 20 mg daily,  and Seroquel 200 nightly.  She notes that she was unable to afford hydroxyzine so she did not pick it up. She reports that her other medications are effective in managing her psychiatric conditions.   Today she was pleasant, cooperative, engaged in conversation, and maintained eye contact. She notes that she has been tired die to extra shifts at work. Since her last visit however she reports that her sleep, anxiety, and depression has improved. Patient notes that she is looking forward to vacation. She informed Probation officer that she and her family were on the way to the beach. Today provider conducted a GAD-7 and patient scored an 2, at her last visit she scored a 8.  Provider also conducted PHQ-9 the patient scored an 5, at her last visit she scored a 11.  She endorses having a good appetite.  Today she denies SI/HI/VAH, mania, paranoia.   At this time hydroxyzine not restarted. She will continue her other medications  as prescribed.  No other concerns noted at this time.   . Visit Diagnosis:  No diagnosis found.   Past Psychiatric History: Schizoaffective, PTSD, alcohol use disorder, opioid use disorder, and cocaine use disorder Past Medical History:  Past Medical History:  Diagnosis Date   Ankle fracture    Anxiety    Bipolar disorder ([place])    Depression    No past surgical history on file.  Family Psychiatric History: Mother depression, anxiety, and poly substance use (cocaine and alcohol), Father former heroin abuse (57 years sober), sister (herion use), sister (heroin use), third sister (sober from alcohol for 2 years), brother schizophrenia and (recovering from meth abuse), maternal/paternal grandfather substance use  Family History:  Family History  Problem Relation Age of Onset   Ovarian cancer Mother    Alcoholism Mother    Drug abuse Mother    Drug abuse Sister     Social History:  Social History   Socioeconomic History   Marital status: Single    Spouse name: Not on file   Number of children: Not on file   Years of education: Not on file   Highest education level: Not on file  Occupational History   Not on file  Tobacco Use   Smoking status: Every Day    Packs/day: 1.00    Years: 10.00    Total pack years: 10.00    Types: Cigarettes   Smokeless tobacco: Current  Substance and Sexual Activity   Alcohol use: No    Comment: Individual reports  that she has been clean from alcohol since Septemeber 27,2014   Drug use: No    Comment: Pt reports that she has been clean from pills and cocaine since Septemeber 27, 2014   Sexual activity: Not Currently  Other Topics Concern   Not on file  Social History Narrative   Not on file   Social Determinants of Health   Financial Resource Strain: Not on file  Food Insecurity: Not on file  Transportation Needs: Not on file  Physical Activity: Not on file  Stress: Not on file  Social Connections: Not on file    Allergies:   Allergies  Allergen Reactions   Depakote Er [Divalproex Sodium Er] Swelling    Metabolic Disorder Labs: Lab Results  Component Value Date   HGBA1C 5.5 10/20/2015   MPG 111 10/20/2015   No results found for: "PROLACTIN" Lab Results  Component Value Date   CHOL (H) 09/19/2008    205        ATP III CLASSIFICATION:  <200     mg/dL   Desirable  200-239  mg/dL   Borderline High  >=240    mg/dL   High   TRIG 246 (H) 09/19/2008   HDL 21 (L) 09/19/2008   CHOLHDL 9.8 09/19/2008   VLDL 49 (H) 09/19/2008   LDLCALC (H) 09/19/2008    135        Total Cholesterol/HDL:CHD Risk Coronary Heart Disease Risk Table                     Men   Women  1/2 Average Risk   3.4   3.3     Therapeutic Level Labs: Lab Results  Component Value Date   LITHIUM 0.07 (L) 12/14/2015   Lab Results  Component Value Date   VALPROATE <1.0 Result repeated and verified. (L) 09/16/2008   VALPROATE 35.5 (L) 01/30/2008   No results found for: "CBMZ"  Current Medications: Current Outpatient Medications  Medication Sig Dispense Refill   busPIRone (BUSPAR) 30 MG tablet Take 1 tablet (30 mg total) by mouth 2 (two) times daily. 60 tablet 3   FLUoxetine (PROZAC) 20 MG capsule Take 1 capsule (20 mg total) by mouth daily. 30 capsule 3   hydrOXYzine (ATARAX) 25 MG tablet Take 1 tablet (25 mg total) by mouth 3 (three) times daily as needed. 90 tablet 3   QUEtiapine (SEROQUEL) 200 MG tablet Take 1 tablet (200 mg total) by mouth at bedtime. 30 tablet 3   No current facility-administered medications for this visit.     Musculoskeletal: Strength & Muscle Tone: within normal limits and telehealth visit Gait & Station: normal, telehealth visit Patient leans: N/A  Psychiatric Specialty Exam: Review of Systems  There were no vitals taken for this visit.There is no height or weight on file to calculate BMI.  General Appearance: Well Groomed  Eye Contact:  Good  Speech:  Clear and Coherent and Normal Rate   Volume:  Normal  Mood:  Anxious and Depressed  Affect:  Appropriate and Congruent  Thought Process:  Coherent, Goal Directed and Linear  Orientation:  NA  Thought Content: WDL and Logical   Suicidal Thoughts:  No  Homicidal Thoughts:  No  Memory:  Immediate;   Good Recent;   Good Remote;   Good  Judgement:  Good  Insight:  Good  Psychomotor Activity:  Normal  Concentration:  Concentration: Good and Attention Span: Good  Recall:  Good  Fund of Knowledge: Good  Language:  Good  Akathisia:  No  Handed:  Right  AIMS (if indicated): Not done  Assets:  Communication Skills Desire for Improvement Financial Resources/Insurance Housing Social Support  ADL's:  Intact  Cognition: WNL  Sleep:  Good   Screenings: [place] Admission (Discharged) from OP Visit from 12/13/2015 in [place] [facility] Admission (Discharged) from OP Visit from 10/18/2015 in [place] [facility] Admission (Discharged) from OP Visit from 05/09/2015 in [place] Total Score 0 0 0      AUDIT    Flowsheet Row Admission (Discharged) from OP Visit from 12/13/2015 in [place] [facility] Admission (Discharged) from OP Visit from 10/18/2015 in [place] [facility] Admission (Discharged) from OP Visit from 05/09/2015 in [place]  Alcohol Use Disorder Identification Test Final Score (AUDIT) 0 2 4      GAD-7    Flowsheet Row Video Visit from 12/05/2022 in [facility] Video Visit from 09/05/2022 in [facility] Video Visit from 06/06/2022 in [facility] Video Visit from 01/17/2022 in [facility] Video Visit from 10/23/2021 in [facility]  Total GAD-7 Score '18 2 3 4 8      '$ PHQ2-9    Flowsheet Row Video Visit  from 12/05/2022 in [facility] Video Visit from 09/05/2022 in [facility] Video Visit from 06/06/2022 in [facility] Video Visit from 01/17/2022 in [facility] Video Visit from 10/23/2021 in [facility]  PHQ-2 Total Score 4 0 0 0 0  PHQ-9 Total Score 11 1 -- 3 3      Flowsheet Row Video Visit from 06/06/2022 in [facility] Video Visit from 07/17/2021 in [facility] Video Visit from 04/17/2021 in [place] Error: Q7 should not be populated when Q6 is No Error: Q7 should not be populated when Q6 is No        Assessment and Plan: Patient reports that his sleep, anxiety, and depression has improved since her last visit. At this time hydroxyzine not restarted. She will continue her other medications as prescribed.  1. Schizoaffective disorder, bipolar type (HCC)  Continue- FLUoxetine (PROZAC) 20 MG capsule; Take 1 capsule (20 mg total) by mouth daily.  Dispense: 30 capsule; Refill: 3 Continue- QUEtiapine (SEROQUEL) 200 MG tablet; Take 1 tablet (200 mg total) by mouth at bedtime.  Dispense: 30 tablet; Refill: 3  2. GAD (generalized anxiety disorder)  Continue- FLUoxetine (PROZAC) 20 MG capsule; Take 1 capsule (20 mg total) by mouth daily.  Dispense: 30 capsule; Refill: 3 Continue- busPIRone (BUSPAR) 30 MG tablet; Take 1 tablet (30 mg total) by mouth 2 (two) times daily.  Dispense: 60 tablet; Refill: 3    Follow-up in 3 months Follow-up with therapy    Salley Slaughter, NP 03/06/2023, 8:29 AM

## 2023-05-15 ENCOUNTER — Encounter (HOSPITAL_COMMUNITY): Payer: Self-pay | Admitting: Psychiatry

## 2023-05-15 ENCOUNTER — Telehealth (INDEPENDENT_AMBULATORY_CARE_PROVIDER_SITE_OTHER): Payer: 59 | Admitting: Psychiatry

## 2023-05-15 DIAGNOSIS — F250 Schizoaffective disorder, bipolar type: Secondary | ICD-10-CM | POA: Diagnosis not present

## 2023-05-15 DIAGNOSIS — F411 Generalized anxiety disorder: Secondary | ICD-10-CM | POA: Diagnosis not present

## 2023-05-15 MED ORDER — BUSPIRONE HCL 30 MG PO TABS: 30.0000 mg | ORAL_TABLET | Freq: Two times a day (BID) | ORAL | 3 refills | Status: DC

## 2023-05-15 MED ORDER — FLUOXETINE HCL 20 MG PO CAPS: 20.0000 mg | ORAL_CAPSULE | Freq: Every day | ORAL | 3 refills | Status: DC

## 2023-05-15 MED ORDER — QUETIAPINE FUMARATE 200 MG PO TABS: 200.0000 mg | ORAL_TABLET | Freq: Every day | ORAL | 3 refills | Status: DC

## 2023-05-15 NOTE — Progress Notes (Addendum)
BH MD/PA/NP OP Progress Note Virtual Visit via Video Note  I connected with Monica Huffman on 05/15/23 at  8:30 AM EDT by a video enabled telemedicine application and verified that I am speaking with the correct person using two identifiers.  Location: Patient: Home Provider: Clinic   I discussed the limitations of evaluation and management by telemedicine and the availability of in person appointments. The patient expressed understanding and agreed to proceed.  I provided 30 minutes of non-face-to-face time during this encounter.        05/15/2023 8:46 AM Monica Huffman  MRN:  [id number]  Chief Complaint: "I have been busy"   HPI: 32 year old female seen today for follow up psychiatric evaluation.  She has a psychiatric history of bipolar disorder, schizoaffective disorder, anxiety, PTSD, alcohol use disorder, opioid use disorder, and cocaine use disorder.    Currently she is managed on BuSpar 30 mg twice daily, Prozac 20 mg daily,  and Seroquel 200 nightly.  She notes that her medications are effective in managing her psychiatric conditions.   Today she was pleasant, cooperative, engaged in conversation, and maintained eye contact. 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  Patient informed Clinical research associate that she and her family are fostering a 33 and 63-year-old boy.  She also informed Clinical research associate that they care for 60-year-old niece.  Patient notes that work has been hectic as they are short staffed.  Despite the stressors patient notes that her mood is stable and informed writer that she has minimal anxiety and depression.  Today provider conducted a GAD-7 and patient scored a 7, at her last visit she scored 2.  She reports that her mainly worries about taking care of her children.  Provider also conducted PHQ-9 the patient scored a 2, at her last visit she scored a 5.  She endorses adequate sleep and appetite.  Today she denies SI/HI/AVH, mania, paranoia.  Patient did Advertising account executive that 2 weeks ago she saw shadows but notes that this is infrequent  Overall she notes that she is doing well.  No medication changes made today. Patient is agreeable to taking medications.  She has not had labs in over a year.  Today CBC, CMP, LFT, thyroid panel, HgbA1c, lipid panel, and prolactin levels ordered.  No other concerns noted at this time.   . Visit Diagnosis:    ICD-10-CM   1. GAD (generalized anxiety disorder)  F41.1 busPIRone (BUSPAR) 30 MG tablet    FLUoxetine (PROZAC) 20 MG capsule    2. Schizoaffective disorder, bipolar type (HCC)  F25.0 FLUoxetine (PROZAC) 20 MG capsule    QUEtiapine (SEROQUEL) 200 MG tablet    CBC w/Diff/Platelet    Lipid Profile    Comprehensive Metabolic Panel (CMET)    Hepatic function panel    Thyroid Panel With TSH    HgB A1c    Prolactin      Past Psychiatric History: Schizoaffective, PTSD, alcohol use disorder, opioid use disorder, and cocaine use disorder Past Medical History:  Past Medical History:  Diagnosis Date   Ankle fracture    Anxiety    Bipolar disorder (HCC)    Depression    History reviewed. No pertinent surgical history.  Family Psychiatric History: Mother depression, anxiety, and poly substance use (cocaine and alcohol), Father former heroin abuse (25 years sober), sister (herion use), sister (heroin use), third sister (sober from alcohol for 2 years), brother schizophrenia and (recovering from meth abuse), maternal/paternal grandfather substance use  Family History:  Family History  Problem Relation Age of Onset   Ovarian cancer Mother    Alcoholism Mother    Drug abuse Mother    Drug abuse Sister     Social History:  Social History   Socioeconomic History   Marital status: Single    Spouse name: Not on file   Number of children: Not on file   Years of education: Not on file   Highest education level: Not on file  Occupational History   Not on file  Tobacco Use   Smoking status: Every Day     Packs/day: 1.00    Years: 10.00    Additional pack years: 0.00    Total pack years: 10.00    Types: Cigarettes   Smokeless tobacco: Current  Substance and Sexual Activity   Alcohol use: No    Comment: Individual reports that she has been clean from alcohol since Septemeber 27,2014   Drug use: No    Comment: Pt reports that she has been clean from pills and cocaine since Septemeber 27, 2014   Sexual activity: Not Currently  Other Topics Concern   Not on file  Social History Narrative   Not on file   Social Determinants of Health   Financial Resource Strain: Not on file  Food Insecurity: Not on file  Transportation Needs: Not on file  Physical Activity: Not on file  Stress: Not on file  Social Connections: Not on file    Allergies:  Allergies  Allergen Reactions   Depakote Er [Divalproex Sodium Er] Swelling    Metabolic Disorder Labs: Lab Results  Component Value Date   HGBA1C 5.5 10/20/2015   MPG 111 10/20/2015   No results found for: "PROLACTIN" Lab Results  Component Value Date   CHOL (H) 09/19/2008    205        ATP III CLASSIFICATION:  <200     mg/dL   Desirable  409-811  mg/dL   Borderline High  >=914    mg/dL   High   TRIG 782 (H) 95/62/1308   HDL 21 (L) 09/19/2008   CHOLHDL 9.8 09/19/2008   VLDL 49 (H) 09/19/2008   LDLCALC (H) 09/19/2008    135        Total Cholesterol/HDL:CHD Risk Coronary Heart Disease Risk Table                     Men   Women  1/2 Average Risk   3.4   3.3     Therapeutic Level Labs: Lab Results  Component Value Date   LITHIUM 0.07 (L) 12/14/2015   Lab Results  Component Value Date   VALPROATE <1.0 Result repeated and verified. (L) 09/16/2008   VALPROATE 35.5 (L) 01/30/2008   No results found for: "CBMZ"  Current Medications: Current Outpatient Medications  Medication Sig Dispense Refill   busPIRone (BUSPAR) 30 MG tablet Take 1 tablet (30 mg total) by mouth 2 (two) times daily. 60 tablet 3   FLUoxetine (PROZAC) 20  MG capsule Take 1 capsule (20 mg total) by mouth daily. 30 capsule 3   QUEtiapine (SEROQUEL) 200 MG tablet Take 1 tablet (200 mg total) by mouth at bedtime. 30 tablet 3   No current facility-administered medications for this visit.     Musculoskeletal: Strength & Muscle Tone: within normal limits and telehealth visit Gait & Station: normal, telehealth visit Patient leans: N/A  Psychiatric Specialty Exam: Review of Systems  There were no vitals taken  for this visit.There is no height or weight on file to calculate BMI.  General Appearance: Well Groomed  Eye Contact:  Good  Speech:  Clear and Coherent and Normal Rate  Volume:  Normal  Mood:  Anxious and Depressed  Affect:  Appropriate and Congruent  Thought Process:  Coherent, Goal Directed and Linear  Orientation:  NA  Thought Content: WDL and Logical   Suicidal Thoughts:  No  Homicidal Thoughts:  No  Memory:  Immediate;   Good Recent;   Good Remote;   Good  Judgement:  Good  Insight:  Good  Psychomotor Activity:  Normal  Concentration:  Concentration: Good and Attention Span: Good  Recall:  Good  Fund of Knowledge: Good  Language: Good  Akathisia:  No  Handed:  Right  AIMS (if indicated): Not done  Assets:  Communication Skills Desire for Improvement Financial Resources/Insurance Housing Social Support  ADL's:  Intact  Cognition: WNL  Sleep:  Good   Screenings: AIMS    Flowsheet Row [facility]) [facility] (Discharged) from [facility]  AIMS Total Score 0 0 0      AUDIT    Flowsheet Row Admission (Discharged) [facility] (Discharged) [facility] Ad[facility] (Discharged) from [facility] Visit from [facility]/2015 [facility]  Alcohol Use Disorder Identification Test Final Score (AUDIT) 0 2 4      GAD-7    Flowsheet Row Video Visit from 05/15/2023 in [facility] Video Visit from 03/06/2023 in [facility] Video Visit from 12/05/2022 in [facility] Video Visit from 09/05/2022 in [facility] Video Visit from 06/06/2022 in [facility]  Total GAD-7 Score 7 2 18 2 3       PHQ2-9    Flowsheet Row Video Visit from 05/15/2023 in [facility] Video Visit from 03/06/2023 in [facility] Video Visit from 12/05/2022 in [facility] Video Visit from 09/05/2022 in [facility] Video Visit from 06/06/2022 in [facility]  PHQ-2 Total Score 0 2 4 0 0  PHQ-9 Total Score 2 5 11 1  --      Flowsheet Row Video Visit from 06/06/2022 in [facility] Video Visit from 07/17/2021 in [facility] Video Visit from 04/17/2021 in [facility]  C-SSRS RISK CATEGORY Low Risk Error: Q7 should not be populated when Q6 is No Error: Q7 should not be populated when Q6 is No        Assessment and Plan: Patient reports that she is doing well on her current medication regimen.  No medication changes made today.  Patient agreeable to continue medications as prescribed.She has not had labs in over a year.  Today CBC, CMP, LFT, thyroid panel, HgbA1c, lipid panel, and prolactin levels ordered.   1. Schizoaffective disorder, bipolar type (HCC)  Continue- FLUoxetine (PROZAC) 20 MG capsule; Take 1 capsule (20 mg total) by mouth daily.  Dispense: 30 capsule; Refill: 3 Continue- QUEtiapine (SEROQUEL) 200  MG tablet; Take 1 tablet (200 mg total) by mouth  at bedtime.  Dispense: 30 tablet; Refill: 3 - CBC w/Diff/Platelet - Lipid Profile - Comprehensive Metabolic Panel (CMET) - Hepatic function panel - Thyroid Panel With TSH - HgB A1c - Prolactin   2. GAD (generalized anxiety disorder)  Continue- FLUoxetine (PROZAC) 20 MG capsule; Take 1 capsule (20 mg total) by mouth daily.  Dispense: 30 capsule; Refill: 3 Continue- busPIRone (BUSPAR) 30 MG tablet; Take 1 tablet (30 mg total) by mouth 2 (two) times daily.  Dispense: 60 tablet; Refill: 3    Follow-up in 3 months Follow-up with therapy    Shanna Cisco, NP 05/15/2023, 8:46 AM

## 2023-06-04 ENCOUNTER — Other Ambulatory Visit (HOSPITAL_COMMUNITY): Payer: Self-pay | Admitting: Psychiatry

## 2023-06-04 DIAGNOSIS — F411 Generalized anxiety disorder: Secondary | ICD-10-CM

## 2023-06-04 DIAGNOSIS — F250 Schizoaffective disorder, bipolar type: Secondary | ICD-10-CM

## 2023-07-17 ENCOUNTER — Other Ambulatory Visit (HOSPITAL_COMMUNITY): Payer: Self-pay | Admitting: Psychiatry

## 2023-07-17 ENCOUNTER — Encounter (HOSPITAL_COMMUNITY): Payer: Self-pay | Admitting: Psychiatry

## 2023-07-17 ENCOUNTER — Telehealth (INDEPENDENT_AMBULATORY_CARE_PROVIDER_SITE_OTHER): Payer: 59 | Admitting: Psychiatry

## 2023-07-17 DIAGNOSIS — F250 Schizoaffective disorder, bipolar type: Secondary | ICD-10-CM

## 2023-07-17 DIAGNOSIS — F411 Generalized anxiety disorder: Secondary | ICD-10-CM

## 2023-07-17 MED ORDER — QUETIAPINE FUMARATE 200 MG PO TABS: 200.0000 mg | ORAL_TABLET | Freq: Every day | ORAL | 3 refills | Status: DC

## 2023-07-17 MED ORDER — FLUOXETINE HCL 20 MG PO CAPS
20.0000 mg | ORAL_CAPSULE | Freq: Every day | ORAL | 3 refills | Status: AC
Start: 2023-07-17 — End: ?

## 2023-07-17 MED ORDER — QUETIAPINE FUMARATE 200 MG PO TABS
200.0000 mg | ORAL_TABLET | Freq: Every day | ORAL | 3 refills | Status: AC
Start: 2023-07-17 — End: ?

## 2023-07-17 MED ORDER — BUSPIRONE HCL 30 MG PO TABS
30.0000 mg | ORAL_TABLET | Freq: Two times a day (BID) | ORAL | 3 refills | Status: DC
Start: 2023-07-17 — End: 2023-11-11

## 2023-07-17 MED ORDER — FLUOXETINE HCL 20 MG PO CAPS
20.0000 mg | ORAL_CAPSULE | Freq: Every day | ORAL | 3 refills | Status: DC
Start: 2023-07-17 — End: 2023-07-17

## 2023-07-17 MED ORDER — BUSPIRONE HCL 30 MG PO TABS: 30.0000 mg | ORAL_TABLET | Freq: Two times a day (BID) | ORAL | 3 refills | Status: DC

## 2023-07-17 NOTE — Telephone Encounter (Signed)
Patient connected to online virtual link however could not hear provider. Provider called patient and she continued to have connection issues. Patient would pick up, say hello, and then her phone would hang up. Provider refilled prescriptions and requested that front desk staff rescheduled patient. No other concerns noted at this time.

## 2023-07-17 NOTE — Progress Notes (Deleted)
BH MD/PA/NP OP Progress Note Virtual Visit via Video Note  I connected with Monica Huffman on 07/17/23 at  8:30 AM EDT by a video enabled telemedicine application and verified that I am speaking with the correct person using two identifiers.  Location: Patient: Home Provider: Clinic   I discussed the limitations of evaluation and management by telemedicine and the availability of in person appointments. The patient expressed understanding and agreed to proceed.  I provided 30 minutes of non-face-to-face time during this encounter.        07/17/2023 8:51 AM Monica Huffman  MRN:  [id number]  Chief Complaint: "I have been busy"   HPI: 32 year old female seen today for follow up psychiatric evaluation.  She has a psychiatric history of bipolar disorder, schizoaffective disorder, anxiety, PTSD, alcohol use disorder, opioid use disorder, and cocaine use disorder.    Currently she is managed on BuSpar 30 mg twice daily, Prozac 20 mg daily,  and Seroquel 200 nightly.  She notes that her medications are effective in managing her psychiatric conditions.   Today she was pleasant, cooperative, engaged in conversation, and maintained eye contact. 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  She also informed Clinical research associate that they care for 70-year-old niece.  Patient notes that work has been hectic as they are short staffed.  Despite the stressors patient notes that her mood is stable and informed writer that she has minimal anxiety and depression.  Today provider conducted a GAD-7 and patient scored a 7, at her last visit she scored 2.  She reports that her mainly worries about taking care of her children.  Provider also conducted PHQ-9 the patient scored a 2, at her last visit she scored a 5.  She endorses adequate sleep and appetite.  Today she denies SI/HI/AVH, mania, paranoia.  Patient did Advertising account executive that 2 weeks ago she saw shadows but notes that this is infrequent  Overall she notes that she is doing well.  No medication changes made today. Patient is agreeable to taking medications.  She has not had labs in over a year.  Today CBC, CMP, LFT, thyroid panel, HgbA1c, lipid panel, and prolactin levels ordered.  No other concerns noted at this time.   . Visit Diagnosis:  No diagnosis found.   Past Psychiatric History: Schizoaffective, PTSD, alcohol use disorder, opioid use disorder, and cocaine use disorder Past Medical History:  Past Medical History:  Diagnosis Date   Ankle fracture    Anxiety    Bipolar disorder (HCC)    Depression    No past surgical history on file.  Family Psychiatric History: Mother depression, anxiety, and poly substance use (cocaine and alcohol), Father former heroin abuse (25 years sober), sister (herion use), sister (heroin use), third sister (sober from alcohol for 2 years), brother schizophrenia and (recovering from meth abuse), maternal/paternal grandfather substance use  Family History:  Family History  Problem Relation Age of Onset   Ovarian cancer Mother    Alcoholism Mother    Drug abuse Mother    Drug abuse Sister     Social History:  Social History   Socioeconomic History   Marital status: Single    Spouse name: Not on file   Number of children: Not on file   Years of education: Not on file   Highest education level: Not on file  Occupational History  Not on file  Tobacco Use   Smoking status: Every Day    Current packs/day: 1.00    Average packs/day: 1 pack/day for 10.0 years (10.0 ttl pk-yrs)    Types: Cigarettes   Smokeless tobacco: Current  Substance and Sexual Activity   Alcohol use: No    Comment: Individual reports that she has been clean from alcohol since Septemeber 27,2014   Drug use: No    Comment: Pt reports that she has been clean from pills and cocaine since Septemeber 27, 2014   Sexual activity: Not  Currently  Other Topics Concern   Not on file  Social History Narrative   Not on file   Social Determinants of Health   Financial Resource Strain: Not on file  Food Insecurity: Not on file  Transportation Needs: Not on file  Physical Activity: Not on file  Stress: Not on file  Social Connections: Unknown (04/29/2022)   Received from [facility]   Social Network    Social Network: Not on file    Allergies:  Allergies  Allergen Reactions   Depakote Er [Divalproex Sodium Er] Swelling    Metabolic Disorder Labs: Lab Results  Component Value Date   HGBA1C 5.5 10/20/2015   MPG 111 10/20/2015   No results found for: "PROLACTIN" Lab Results  Component Value Date   CHOL (H) 09/19/2008    205        ATP III CLASSIFICATION:  <200     mg/dL   Desirable  161-096  mg/dL   Borderline High  >=045    mg/dL   High   TRIG 409 (H) 81/19/1478   HDL 21 (L) 09/19/2008   CHOLHDL 9.8 09/19/2008   VLDL 49 (H) 09/19/2008   LDLCALC (H) 09/19/2008    135        Total Cholesterol/HDL:CHD Risk Coronary Heart Disease Risk Table                     Men   Women  1/2 Average Risk   3.4   3.3     Therapeutic Level Labs: Lab Results  Component Value Date   LITHIUM 0.07 (L) 12/14/2015   Lab Results  Component Value Date   VALPROATE <1.0 Result repeated and verified. (L) 09/16/2008   VALPROATE 35.5 (L) 01/30/2008   No results found for: "CBMZ"  Current Medications: Current Outpatient Medications  Medication Sig Dispense Refill   busPIRone (BUSPAR) 30 MG tablet Take 1 tablet (30 mg total) by mouth 2 (two) times daily. 60 tablet 3   FLUoxetine (PROZAC) 20 MG capsule Take 1 capsule (20 mg total) by mouth daily. 30 capsule 3   QUEtiapine (SEROQUEL) 200 MG tablet Take 1 tablet (200 mg total) by mouth at bedtime. 30 tablet 3   No current facility-administered medications for this visit.     Musculoskeletal: Strength & Muscle Tone: within normal limits and telehealth visit Gait &  Station: normal, telehealth visit Patient leans: N/A  Psychiatric Specialty Exam: Review of Systems  There were no vitals taken for this visit.There is no height or weight on file to calculate BMI.  General Appearance: Well Groomed  Eye Contact:  Good  Speech:  Clear and Coherent and Normal Rate  Volume:  Normal  Mood:  Anxious and Depressed  Affect:  Appropriate and Congruent  Thought Process:  Coherent, Goal Directed and Linear  Orientation:  NA  Thought Content: WDL and Logical   Suicidal Thoughts:  No  Homicidal Thoughts:  No  Memory:  Immediate;   Good Recent;   Good Remote;   Good  Judgement:  Good  Insight:  Good  Psychomotor Activity:  Normal  Concentration:  Concentration: Good and Attention Span: Good  Recall:  Good  Fund of Knowledge: Good  Language: Good  Akathisia:  No  Handed:  Right  AIMS (if indicated): Not done  Assets:  Communication Skills Desire for Improvement Financial Resources/Insurance Housing Social Support  ADL's:  Intact  Cognition: WNL  Sleep:  Good   Screenings: AIMS    Flowsheet Row Ad[facility] (Discharged) [facility] Ad[facility] (Discharged) from [facility] Admission (Discharged) from [facility]  AIMS Total Score 0 0 0      AUDIT    Flowsheet Row Admission (Discharged) [facility] Admission (Discharged) from [facility] Admission (Discharged) from OP Visit from [facility]/2015 [facility]  Alcohol Use Disorder Identification Test Final Score (AUDIT) 0 2 4      GAD-7    Flowsheet Row Video Visit from 05/15/2023 in [facility] Video Visit from 03/06/2023 in [facility] Video Visit from 12/05/2022 in [facility] Video Visit from 09/05/2022 in [facility] Video Visit from 06/06/2022 in [facility]  Total GAD-7 Score 7 2 18 2 3       PHQ2-9    Flowsheet Row Video Visit from 05/15/2023 in [facility] Video Visit from 03/06/2023 in [facility] Video Visit from 12/05/2022 in [facility] Video Visit from 09/05/2022 in [facility] Video Visit from 06/06/2022 in [facility]  PHQ-2 Total Score 0 2 4 0 0  PHQ-9 Total Score 2 5 11 1  --      Flowsheet Row Video Visit from 06/06/2022 in [facility] Video Visit from 07/17/2021 in [facility] Video Visit from 04/17/2021 in [facility]  C-SSRS RISK CATEGORY Low Risk Error: Q7 should not be populated when Q6 is No Error: Q7 should not be populated when Q6 is No        Assessment and Plan: Patient reports that she is doing well on her current medication regimen.  No medication changes made today.  Patient agreeable to continue medications as prescribed.She has not had labs in over a year.  Today CBC, CMP, LFT, thyroid panel, HgbA1c, lipid panel, and prolactin levels ordered.   1. Schizoaffective disorder, bipolar type (HCC)  Continue- FLUoxetine (PROZAC) 20 MG capsule; Take 1 capsule (20 mg total) by mouth daily.  Dispense: 30 capsule; Refill: 3 Continue- QUEtiapine (SEROQUEL) 200 MG tablet; Take 1 tablet (200 mg total) by mouth at bedtime.  Dispense: 30 tablet; Refill: 3 - CBC w/Diff/Platelet - Lipid Profile - Comprehensive Metabolic Panel (CMET) - Hepatic function panel - Thyroid Panel With TSH - HgB A1c - Prolactin   2. GAD (generalized anxiety disorder)  Continue- FLUoxetine (PROZAC) 20 MG capsule;  Take 1 capsule (20 mg total) by mouth daily.  Dispense: 30 capsule; Refill: 3 Continue- busPIRone (BUSPAR) 30 MG tablet; Take 1 tablet (30 mg total) by mouth 2 (  two) times daily.  Dispense: 60 tablet; Refill: 3    Follow-up in 3 months Follow-up with therapy    Shanna Cisco, NP 07/17/2023, 8:51 AM

## 2023-07-17 NOTE — Progress Notes (Signed)
BH MD/PA/NP OP Progress Note Virtual Visit via Telephone Note  I connected with Monica Huffman on 07/17/23 at  8:30 AM EDT by telephone and verified that I am speaking with the correct person using two identifiers.  Location: Patient: home Provider: Clinic   I discussed the limitations, risks, security and privacy concerns of performing an evaluation and management service by telephone and the availability of in person appointments. I also discussed with the patient that there may be a patient responsible charge related to this service. The patient expressed understanding and agreed to proceed.   I provided 30 minutes of non-face-to-face time during this encounter.         07/17/2023 10:54 AM Monica Huffman  MRN:  [id number]  Chief Complaint: "We just bought a home"   HPI: 32 year old female seen today for follow up psychiatric evaluation.  She has a psychiatric history of bipolar disorder, schizoaffective disorder, anxiety, PTSD, alcohol use disorder, opioid use disorder, and cocaine use disorder.    Currently she is managed on BuSpar 30 mg twice daily, Prozac 20 mg daily,  and Seroquel 200 nightly.  She reports that her medications are effective in managing her psychiatric conditions.   Today patient logged in virtually but she could not keep a connection. Provider called patient and she again lost connection. Patient called back 30 min after her scheduled appointment and the visit was conducted over the phone. 15 percent of the visit was virtual and 85 percent was done over the phone. During exam she was well groomed, pleasant, cooperative, engaged in conversation, and maintained eye contact. She informed Clinical research associate that recently she and her family purchased a home in [place] and will be moving later this week. She informed Clinical research associate that she is busy preparing for this transition. She also notes that she has been busy at work and caring for her biological and forster children.  Today she  reports that her mood is stable and notes that she has mini amal anxiety and depression. Today provider conducted a GAD-7 and patient scored an 10, at her last visit she scored a 2.  Provider also conducted PHQ-9 the patient scored an 0, at her last visit she scored a 5.  She endorses having adequate sleep and appetite.  Today she denies SI/HI/VAH, mania, paranoia.   No medication changes made today. Patient agreeable to continue medications as prescribed. Patient will follow up with provider at [facility] as she now lives is [place].  No other concerns noted at this time.   . Visit Diagnosis:    ICD-10-CM   1. GAD (generalized anxiety disorder)  F41.1 busPIRone (BUSPAR) 30 MG tablet    FLUoxetine (PROZAC) 20 MG capsule    2. Schizoaffective disorder, bipolar type (HCC)  F25.0 FLUoxetine (PROZAC) 20 MG capsule    QUEtiapine (SEROQUEL) 200 MG tablet       Past Psychiatric History: Schizoaffective, PTSD, alcohol use disorder, opioid use disorder, and cocaine use disorder Past Medical History:  Past Medical History:  Diagnosis Date   Ankle fracture    Anxiety    Bipolar disorder (HCC)    Depression    History reviewed. No pertinent surgical history.  Family Psychiatric History: Mother depression, anxiety, and poly substance use (cocaine and alcohol), Father former heroin abuse (25 years sober), sister (herion use), sister (heroin use), third sister (sober from alcohol for 2 years), brother schizophrenia and (recovering from meth abuse), maternal/paternal grandfather substance use  Family History:  Family History  Problem  Relation Age of Onset   Ovarian cancer Mother    Alcoholism Mother    Drug abuse Mother    Drug abuse Sister     Social History:  Social History   Socioeconomic History   Marital status: Single    Spouse name: Not on file   Number of children: Not on file   Years of education: Not on file   Highest education level: Not on file  Occupational History    Not on file  Tobacco Use   Smoking status: Every Day    Current packs/day: 1.00    Average packs/day: 1 pack/day for 10.0 years (10.0 ttl pk-yrs)    Types: Cigarettes   Smokeless tobacco: Current  Substance and Sexual Activity   Alcohol use: No    Comment: Individual reports that she has been clean from alcohol since Septemeber 27,2014   Drug use: No    Comment: Pt reports that she has been clean from pills and cocaine since Septemeber 27, 2014   Sexual activity: Not Currently  Other Topics Concern   Not on file  Social History Narrative   Not on file   Social Determinants of Health   Financial Resource Strain: Not on file  Food Insecurity: Not on file  Transportation Needs: Not on file  Physical Activity: Not on file  Stress: Not on file  Social Connections: Unknown (04/29/2022)   Received from [facility]   Social Network    Social Network: Not on file    Allergies:  Allergies  Allergen Reactions   Depakote Er [Divalproex Sodium Er] Swelling    Metabolic Disorder Labs: Lab Results  Component Value Date   HGBA1C 5.5 10/20/2015   MPG 111 10/20/2015   No results found for: "PROLACTIN" Lab Results  Component Value Date   CHOL (H) 09/19/2008    205        ATP III CLASSIFICATION:  <200     mg/dL   Desirable  875-643  mg/dL   Borderline High  >=329    mg/dL   High   TRIG 518 (H) 84/16/6063   HDL 21 (L) 09/19/2008   CHOLHDL 9.8 09/19/2008   VLDL 49 (H) 09/19/2008   LDLCALC (H) 09/19/2008    135        Total Cholesterol/HDL:CHD Risk Coronary Heart Disease Risk Table                     Men   Women  1/2 Average Risk   3.4   3.3     Therapeutic Level Labs: Lab Results  Component Value Date   LITHIUM 0.07 (L) 12/14/2015   Lab Results  Component Value Date   VALPROATE <1.0 Result repeated and verified. (L) 09/16/2008   VALPROATE 35.5 (L) 01/30/2008   No results found for: "CBMZ"  Current Medications: Current Outpatient Medications  Medication Sig  Dispense Refill   busPIRone (BUSPAR) 30 MG tablet Take 1 tablet (30 mg total) by mouth 2 (two) times daily. 60 tablet 3   FLUoxetine (PROZAC) 20 MG capsule Take 1 capsule (20 mg total) by mouth daily. 30 capsule 3   QUEtiapine (SEROQUEL) 200 MG tablet Take 1 tablet (200 mg total) by mouth at bedtime. 30 tablet 3   No current facility-administered medications for this visit.     Musculoskeletal: Strength & Muscle Tone: within normal limits and telehealth visit Gait & Station: normal, telehealth visit Patient leans: N/A  Psychiatric Specialty Exam: Review of Systems  There were no vitals taken for this visit.There is no height or weight on file to calculate BMI.  General Appearance: Well Groomed  Eye Contact:  Good  Speech:  Clear and Coherent and Normal Rate  Volume:  Normal  Mood:  Euthymic  Affect:  Appropriate and Congruent  Thought Process:  Coherent, Goal Directed and Linear  Orientation:  NA  Thought Content: WDL and Logical   Suicidal Thoughts:  No  Homicidal Thoughts:  No  Memory:  Immediate;   Good Recent;   Good Remote;   Good  Judgement:  Good  Insight:  Good  Psychomotor Activity:  Normal  Concentration:  Concentration: Good and Attention Span: Good  Recall:  Good  Fund of Knowledge: Good  Language: Good  Akathisia:  No  Handed:  Right  AIMS (if indicated): Not done  Assets:  Communication Skills Desire for Improvement Financial Resources/Insurance Housing Social Support  ADL's:  Intact  Cognition: WNL  Sleep:  Good   Screenings: AIMS    Flowsheet Row Ad[facility] (Discharged) [facility] Admission (Discharged) from [facility] Admission (Discharged) from [facility]  AIMS Total Score 0 0 0      AUDIT    Flowsheet Row Admission (Discharged) from [facility] Admission (Discharged) from [facility] Admission (Discharged) from OP Visit from 05/09/2015 [facility]  Alcohol Use Disorder Identification Test Final Score (AUDIT) 0 2 4      GAD-7    Flowsheet Row Video Visit from 07/17/2023 in [facility] Video Visit from 05/15/2023 in [facility] Video Visit from 03/06/2023 in [facility] Video Visit from 12/05/2022 in [facility] Video Visit from 09/05/2022 in [facility]  Total GAD-7 Score 10 7 2 18 2       PHQ2-9    Flowsheet Row Video Visit from 07/17/2023 in [facility] Video Visit from 05[facility]15/2023 in [facility] Video Visit from 03/06/2023 in [facility] Video Visit from 12/05/2022 in [facility] Video Visit from 09/05/2022 in [facility]  PHQ-2 Total Score 0 0 2 4 0  PHQ-9 Total Score 0 2 5 11 1       Flowsheet Row Video Visit from 06/06/2022 in [facility] Video Visit from 07/17/2021 in [facility] Video Visit from 04/17/2021 in [facility]  C-SSRS RISK CATEGORY Low Risk Error: Q7 should not be populated when Q6 is No Error: Q7 should not be populated when Q6 is No        Assessment and Plan: Patient reports that she is doing well on her current medication regimen. No medication changes made today. Patient agreeable to continue medications as prescribed.  Patient will follow up with provider [facility] as she now lives is [place].  1. Schizoaffective disorder, bipolar type (HCC)  Continue- FLUoxetine (PROZAC) 20 MG capsule; Take 1 capsule (20 mg  total) by mouth daily.  Dispense: 30 capsule; Refill: 3 Continue- QUEtiapine (SEROQUEL) 200 MG tablet; Take 1 tablet (200 mg total) by mouth at bedtime.  Dispense: 30 tablet;  Refill: 3  2. GAD (generalized anxiety disorder)  Continue- FLUoxetine (PROZAC) 20 MG capsule; Take 1 capsule (20 mg total) by mouth daily.  Dispense: 30 capsule; Refill: 3 Continue- busPIRone (BUSPAR) 30 MG tablet; Take 1 tablet (30 mg total) by mouth 2 (two) times daily.  Dispense: 60 tablet; Refill: 3    Follow-up in 3 months Follow-up with therapy    Shanna Cisco, NP 07/17/2023, 10:54 AM

## 2023-10-14 ENCOUNTER — Telehealth (HOSPITAL_COMMUNITY): Payer: Self-pay | Admitting: *Deleted

## 2023-10-14 NOTE — Telephone Encounter (Signed)
Prior authorization for Quetiapine Fumarate 200mg  received. Submitted online with cover my meds. Awaiting decision.

## 2023-10-15 ENCOUNTER — Telehealth (HOSPITAL_COMMUNITY): Payer: Self-pay | Admitting: *Deleted

## 2023-10-15 NOTE — Telephone Encounter (Signed)
Thank you for this update

## 2023-10-15 NOTE — Telephone Encounter (Signed)
Checked online with cover my meds for authorization of Seroquel. It has been approved until 10/13/24. Called to notify pharmacy.

## 2023-11-11 ENCOUNTER — Other Ambulatory Visit (HOSPITAL_COMMUNITY): Payer: Self-pay | Admitting: Psychiatry

## 2023-11-11 DIAGNOSIS — F411 Generalized anxiety disorder: Secondary | ICD-10-CM

## 2024-04-10 ENCOUNTER — Other Ambulatory Visit (HOSPITAL_COMMUNITY): Payer: Self-pay | Admitting: Psychiatry

## 2024-04-10 DIAGNOSIS — F411 Generalized anxiety disorder: Secondary | ICD-10-CM
